# Patient Record
Sex: Male | Born: 1995
Health system: Southern US, Community
[De-identification: ages and names within clinical notes are randomized; demographics above are authoritative.]

## PROBLEM LIST (undated history)

## (undated) DIAGNOSIS — K219 Gastro-esophageal reflux disease without esophagitis: Secondary | ICD-10-CM

## (undated) DIAGNOSIS — F32A Depression, unspecified: Secondary | ICD-10-CM

## (undated) DIAGNOSIS — F419 Anxiety disorder, unspecified: Secondary | ICD-10-CM

## (undated) DIAGNOSIS — F329 Major depressive disorder, single episode, unspecified: Secondary | ICD-10-CM

## (undated) DIAGNOSIS — T7840XA Allergy, unspecified, initial encounter: Secondary | ICD-10-CM

## (undated) HISTORY — DX: Allergy, unspecified, initial encounter: T78.40XA

## (undated) HISTORY — DX: Anxiety disorder, unspecified: F41.9

## (undated) HISTORY — DX: Gastro-esophageal reflux disease without esophagitis: K21.9

## (undated) HISTORY — DX: Major depressive disorder, single episode, unspecified: F32.9

## (undated) HISTORY — DX: Depression, unspecified: F32.A

---
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
## 2017-01-06 ENCOUNTER — Ambulatory Visit (INDEPENDENT_AMBULATORY_CARE_PROVIDER_SITE_OTHER): Payer: 59 | Admitting: Family Medicine

## 2017-01-06 VITALS — BP 125/73 | HR 77 | Temp 98.7°F | Ht 73.0 in | Wt 281.0 lb

## 2017-01-06 DIAGNOSIS — Z68.41 Body mass index (BMI) pediatric, greater than or equal to 95th percentile for age: Secondary | ICD-10-CM | POA: Diagnosis not present

## 2017-01-06 DIAGNOSIS — E669 Obesity, unspecified: Secondary | ICD-10-CM | POA: Diagnosis not present

## 2017-01-06 DIAGNOSIS — E8881 Metabolic syndrome: Secondary | ICD-10-CM

## 2017-01-06 NOTE — Progress Notes (Signed)
Office: [phone]  /  Fax: [phone]   HPI:   Chief Complaint: OBESITY Jason Wu is here to discuss his progress with his obesity treatment plan. Jason Wu is on the  keep a food journal with 1600 calories and 75+grams of protein daily and is following his eating plan approximately 90 % of the time. Jason Wu states Jason Wu is running 30 minutes 2 to 3 times per week. Jason Wu continues to do well with weight loss. Jason Wu has increased exercise and increased H2O. Jason Wu is journaling on and off and increased vegetables. Jason Wu feels well. His weight is 281 lb (127.5 kg) today and has had a weight loss of 2 pounds over a period of 2 weeks since his last visit. Jason Wu has lost 21 lbs since starting treatment with [place].  Insulin Resistance Jason Wu has a diagnosis of insulin resistance based on his elevated fasting insulin level >5. Although Jason Wu's blood glucose readings are still under good control, insulin resistance puts him at greater risk of metabolic syndrome and diabetes. Jason Wu is not taking metformin due to GI upset. Jason Wu continues to work on diet, exercise and weight loss to decrease risk of diabetes. Jason Wu denies hypoglycemia.  Wt Readings from Last 500 Encounters:  01/06/17 281 lb (127.5 kg) (>99 %, Z= 2.84)*  12/23/16 283 lb (128.4 kg) (>99 %, Z= 2.86)*  11/19/16 286 lb (129.7 kg) (>99 %, Z= 2.90)*  11/06/16 290 lb (131.5 kg) (>99 %, Z= 2.94)*  10/15/16 288 lb (130.6 kg) (>99 %, Z= 2.92)*  09/26/16 298 lb (135.2 kg) (>99 %, Z= 3.03)*  09/25/16 293 lb (132.9 kg) (>99 %, Z= 2.98)*  09/11/16 (!) 302 lb (137 kg) (>99 %, Z= 3.07)*  07/16/16 (!) 315 lb (142.9 kg) (>99 %, Z= 3.20)*  07/05/16 (!) 310 lb (140.6 kg) (>99 %, Z= 3.16)*  02/01/16 (!) 307 lb (139.3 kg) (>99 %, Z= 3.18)*  11/19/15 282 lb (127.9 kg) (>99 %, Z= 2.96)*  09/05/15 285 lb (129.3 kg) (>99 %, Z= 3.03)*  05/15/15 262 lb (118.8 kg) (>99 %, Z= 2.82)*  05/10/15 262 lb (118.8 kg) (>99 %, Z= 2.82)*   * Growth percentiles are based on CDC 2-20 Years data.      ALLERGIES: No Known Allergies  MEDICATIONS: Current Outpatient Prescriptions on File Prior to Visit  Medication Sig Dispense Refill  . esomeprazole (NEXIUM) 40 MG capsule Take 1 capsule by mouth daily.  3  . loratadine (CLARITIN) 10 MG tablet Take 10 mg by mouth daily. Reported on 02/01/2016    . Vitamin D, Ergocalciferol, (DRISDOL) 50000 units CAPS capsule Take 1 capsule (50,000 Units total) by mouth every 3 (three) days. 30 capsule 0   No current facility-administered medications on file prior to visit.     PAST MEDICAL HISTORY: Past Medical History:  Diagnosis Date  . Allergy   . Anxiety   . Depression   . GERD (gastroesophageal reflux disease)     PAST SURGICAL HISTORY: No past surgical history on file.  SOCIAL HISTORY: Social History  Substance Use Topics  . Smoking status: Never Smoker  . Smokeless tobacco: Never Used  . Alcohol use No    FAMILY HISTORY: Family History  Problem Relation Age of Onset  . Hyperlipidemia Mother   . Depression Mother   . Anxiety disorder Mother   . Obesity Mother   . Hypertension Father     ROS: Review of Systems  Constitutional: Positive for weight loss.  Gastrointestinal: Positive for diarrhea and nausea.  Endo/Heme/Allergies:  Negative hypoglycemia    PHYSICAL EXAM: Blood pressure 125/73, pulse 77, temperature 98.7 F (37.1 C), temperature source Oral, height 6\' 1"  (1.854 m), weight 281 lb (127.5 kg), SpO2 99 %. Body mass index is 37.07 kg/m. Physical Exam  Constitutional: Jason Wu is oriented to person, place, and time. Jason Wu appears well-developed and well-nourished.  Cardiovascular: Normal rate.   Pulmonary/Chest: Effort normal.  Musculoskeletal: Normal range of motion.  Neurological: Jason Wu is oriented to person, place, and time.  Skin: Skin is warm and dry.  Psychiatric: Jason Wu has a normal mood and affect. His behavior is normal.  Vitals reviewed.   RECENT LABS AND TESTS: BMET    Component Value Date/Time    NA 143 12/23/2016 1013   K 4.5 12/23/2016 1013   CL 102 12/23/2016 1013   CO2 24 12/23/2016 1013   GLUCOSE 84 12/23/2016 1013   GLUCOSE 98 05/15/2015 0815   BUN 11 12/23/2016 1013   CREATININE 0.75 (L) 12/23/2016 1013   CALCIUM 9.7 12/23/2016 1013   GFRNONAA 134 12/23/2016 1013   GFRAA 155 12/23/2016 1013   Lab Results  Component Value Date   HGBA1C 5.3 12/23/2016   HGBA1C 5.2 09/11/2016   Lab Results  Component Value Date   INSULIN 27.3 (H) 12/23/2016   INSULIN 23.5 09/11/2016   CBC    Component Value Date/Time   WBC 6.9 09/11/2016 1142   WBC 7.1 05/15/2015 0815   RBC 4.77 09/11/2016 1142   RBC 5.03 05/15/2015 0815   HGB 14.6 05/15/2015 0815   HCT 39.2 09/11/2016 1142   PLT 210 05/15/2015 0815   MCV 82 09/11/2016 1142   MCH 27.7 09/11/2016 1142   MCH 29.0 05/15/2015 0815   MCHC 33.7 09/11/2016 1142   MCHC 33.8 05/15/2015 0815   RDW 14.8 09/11/2016 1142   LYMPHSABS 1.8 09/11/2016 1142   EOSABS 0.1 09/11/2016 1142   BASOSABS 0.0 09/11/2016 1142   Iron/TIBC/Ferritin/ %Sat No results found for: IRON, TIBC, FERRITIN, IRONPCTSAT Lipid Panel     Component Value Date/Time   CHOL 132 12/23/2016 1013   TRIG 59 12/23/2016 1013   HDL 31 (L) 12/23/2016 1013   LDLCALC 89 12/23/2016 1013   Hepatic Function Panel     Component Value Date/Time   PROT 7.9 12/23/2016 1013   ALBUMIN 4.6 12/23/2016 1013   AST 11 12/23/2016 1013   ALT 13 12/23/2016 1013   ALKPHOS 89 12/23/2016 1013   BILITOT 0.3 12/23/2016 1013      Component Value Date/Time   TSH 1.150 09/11/2016 1142    ASSESSMENT AND PLAN: Insulin resistance  Obesity without serious comorbidity with body mass index (BMI) greater than 99th percentile for age in pediatric patient, unspecified obesity type  PLAN:  Insulin Resistance Jason Wu will continue to work on weight loss, exercise, and decreasing simple carbohydrates in his diet to help decrease the risk of diabetes. We dicussed metformin including benefits  and risks. Jason Wu was informed that eating too many simple carbohydrates or too many calories at one sitting increases the likelihood of GI side effects. Jason Wu agrees to continue metformin but change to 1/2 pill every morning. Jason Wu agreed to follow up with [place] as directed to monitor his progress.  Obesity Jason Wu is currently in the action stage of change. As such, his goal is to continue with weight loss efforts Jason Wu has agreed to keep a food journal with 1600 calories and 75+ grams of protein daily Jason Wu has been instructed to work up to a goal of 150  minutes of combined cardio and strengthening exercise per week for weight loss and overall health benefits. We discussed the following Behavioral Modification Strategies today: increasing lean protein intake and decreasing simple carbohydrates   Jason Wu has agreed to follow up with our clinic in 2 weeks. Jason Wu was informed of the importance of frequent follow up visits to maximize his success with intensive lifestyle modifications for his multiple health conditions.  I, Nevada CraneJoanne Murray, am acting as scribe for Quillian Quincearen Herrle, MD  I have reviewed the above documentation for accuracy and completeness, and I agree with the above. -Quillian Quincearen Mervin, MD

## 2017-01-20 ENCOUNTER — Ambulatory Visit (INDEPENDENT_AMBULATORY_CARE_PROVIDER_SITE_OTHER): Payer: 59 | Admitting: Family Medicine

## 2017-01-20 ENCOUNTER — Encounter (INDEPENDENT_AMBULATORY_CARE_PROVIDER_SITE_OTHER): Payer: Self-pay

## 2017-01-20 ENCOUNTER — Telehealth (INDEPENDENT_AMBULATORY_CARE_PROVIDER_SITE_OTHER): Payer: Self-pay | Admitting: Family Medicine

## 2017-01-20 NOTE — Telephone Encounter (Signed)
Called patient left message advising him to call and pay 25.00 no show fee and we will be happy to reschedule his appointment

## 2017-04-08 ENCOUNTER — Ambulatory Visit (INDEPENDENT_AMBULATORY_CARE_PROVIDER_SITE_OTHER): Payer: 59 | Admitting: Physician Assistant

## 2017-04-08 VITALS — BP 113/77 | HR 75 | Temp 98.5°F | Ht 72.0 in | Wt 275.0 lb

## 2017-04-08 DIAGNOSIS — Z68.41 Body mass index (BMI) pediatric, greater than or equal to 95th percentile for age: Secondary | ICD-10-CM

## 2017-04-08 DIAGNOSIS — E559 Vitamin D deficiency, unspecified: Secondary | ICD-10-CM

## 2017-04-08 DIAGNOSIS — Z9189 Other specified personal risk factors, not elsewhere classified: Secondary | ICD-10-CM

## 2017-04-08 DIAGNOSIS — E669 Obesity, unspecified: Secondary | ICD-10-CM | POA: Diagnosis not present

## 2017-04-08 MED ORDER — VITAMIN D (ERGOCALCIFEROL) 1.25 MG (50000 UNIT) PO CAPS: 50000.0000 [IU] | ORAL_CAPSULE | ORAL | 0 refills | Status: DC

## 2017-04-09 NOTE — Progress Notes (Signed)
Office: [phone]  /  Fax: [phone]   HPI:   Chief Complaint: OBESITY Jason Wu is here to discuss his progress with his obesity treatment plan. He is on the  keep a food journal with 1600 calories and 75+ grams of protein  and is following his eating plan approximately 70 % of the time. He states he is exercising cardio for 45 to 120 minutes 4 times per week. Jason Wu continues to do well with weight loss. He has been increasing his protein intake. He will be going off to college and is unsure when he will be back. His weight is 275 lb (124.7 kg) today and has had a weight loss of 6 pounds over a period of 13 weeks since his last visit. He has lost 6 lbs since starting treatment with [place].  Vitamin D deficiency Jason Wu has a diagnosis of vitamin D deficiency. He is currently taking vit D and denies nausea, vomiting or muscle weakness.  At risk for osteopenia Jason Wu is at higher risk of osteopenia and osteoporosis due to vitamin D deficiency.   ALLERGIES: No Known Allergies  MEDICATIONS: Current Outpatient Prescriptions on File Prior to Visit  Medication Sig Dispense Refill  . esomeprazole (NEXIUM) 40 MG capsule Take 1 capsule by mouth daily.  3  . loratadine (CLARITIN) 10 MG tablet Take 10 mg by mouth daily. Reported on 02/01/2016    . metFORMIN (GLUCOPHAGE) 500 MG tablet Take 250 mg by mouth every morning. Take 1/2 tablet (250 mg) every morning.     No current facility-administered medications on file prior to visit.     PAST MEDICAL HISTORY: Past Medical History:  Diagnosis Date  . Allergy   . Anxiety   . Depression   . GERD (gastroesophageal reflux disease)     PAST SURGICAL HISTORY: No past surgical history on file.  SOCIAL HISTORY: Social History  Substance Use Topics  . Smoking status: Never Smoker  . Smokeless tobacco: Never Used  . Alcohol use No    FAMILY HISTORY: Family History  Problem Relation Age of Onset  . Hyperlipidemia Mother   . Depression Mother     . Anxiety disorder Mother   . Obesity Mother   . Hypertension Father     ROS: Review of Systems  Constitutional: Positive for weight loss.  Gastrointestinal: Negative for nausea and vomiting.  Musculoskeletal:       Negative muscle weakness    PHYSICAL EXAM: Blood pressure 113/77, pulse 75, temperature 98.5 F (36.9 C), temperature source Oral, height 6' (1.829 m), weight 275 lb (124.7 kg), SpO2 99 %. Body mass index is 37.3 kg/m. Physical Exam  Constitutional: He is oriented to person, place, and time. He appears well-developed and well-nourished.  Cardiovascular: Normal rate.   Pulmonary/Chest: Effort normal.  Musculoskeletal: Normal range of motion.  Neurological: He is oriented to person, place, and time.  Skin: Skin is warm.  Psychiatric: He has a normal mood and affect. His behavior is normal.  Vitals reviewed.   RECENT LABS AND TESTS: BMET    Component Value Date/Time   NA 143 12/23/2016 1013   K 4.5 12/23/2016 1013   CL 102 12/23/2016 1013   CO2 24 12/23/2016 1013   GLUCOSE 84 12/23/2016 1013   GLUCOSE 98 05/15/2015 0815   BUN 11 12/23/2016 1013   CREATININE 0.75 (L) 12/23/2016 1013   CALCIUM 9.7 12/23/2016 1013   GFRNONAA 134 12/23/2016 1013   GFRAA 155 12/23/2016 1013   Lab Results  Component Value  Date   HGBA1C 5.3 12/23/2016   HGBA1C 5.2 09/11/2016   Lab Results  Component Value Date   INSULIN 27.3 (H) 12/23/2016   INSULIN 23.5 09/11/2016   CBC    Component Value Date/Time   WBC 6.9 09/11/2016 1142   WBC 7.1 05/15/2015 0815   RBC 4.77 09/11/2016 1142   RBC 5.03 05/15/2015 0815   HGB 13.2 09/11/2016 1142   HCT 39.2 09/11/2016 1142   PLT 210 05/15/2015 0815   MCV 82 09/11/2016 1142   MCH 27.7 09/11/2016 1142   MCH 29.0 05/15/2015 0815   MCHC 33.7 09/11/2016 1142   MCHC 33.8 05/15/2015 0815   RDW 14.8 09/11/2016 1142   LYMPHSABS 1.8 09/11/2016 1142   EOSABS 0.1 09/11/2016 1142   BASOSABS 0.0 09/11/2016 1142   Iron/TIBC/Ferritin/  %Sat No results found for: IRON, TIBC, FERRITIN, IRONPCTSAT Lipid Panel     Component Value Date/Time   CHOL 132 12/23/2016 1013   TRIG 59 12/23/2016 1013   HDL 31 (L) 12/23/2016 1013   LDLCALC 89 12/23/2016 1013   Hepatic Function Panel     Component Value Date/Time   PROT 7.9 12/23/2016 1013   ALBUMIN 4.6 12/23/2016 1013   AST 11 12/23/2016 1013   ALT 13 12/23/2016 1013   ALKPHOS 89 12/23/2016 1013   BILITOT 0.3 12/23/2016 1013      Component Value Date/Time   TSH 1.150 09/11/2016 1142    ASSESSMENT AND PLAN: Vitamin D deficiency - Plan: Vitamin D, Ergocalciferol, (DRISDOL) 50000 units CAPS capsule  At risk for osteoporosis  Obesity without serious comorbidity with body mass index (BMI) greater than 99th percentile for age in pediatric patient, unspecified obesity type  PLAN:  Vitamin D Deficiency Jason Wu was informed that low vitamin D levels contributes to fatigue and are associated with obesity, breast, and colon cancer. He agrees to continue to take prescription Vit D @50 ,000 IU every week, we will refill for 1 month and will follow up for routine testing of vitamin D, at least 2-3 times per year. He was informed of the risk of over-replacement of vitamin D and agrees to not increase his dose unless he discusses this with [place] first. Doy agrees to follow up with our clinic in at least 4 months.  At risk for osteopenia Jason Wu is at risk for osteopenia and osteoporsis due to his vitamin D deficiency. He was encouraged to take his vitamin D and follow his higher calcium diet and increase strengthening exercise to help strengthen his bones and decrease his risk of osteopenia and osteoporosis.  Obesity Jason Wu is currently in the action stage of change. As such, his goal is to continue with weight loss efforts He has agreed to keep a food journal with 1300 to 1700 calories and 75+ grams of protein daily Jason Wu has been instructed to work up to a goal of 150 minutes of combined  cardio and strengthening exercise per week for weight loss and overall health benefits. We discussed the following Behavioral Modification Strategies today: planning for success and decrease eating out  Jason Wu is going off to college and doesn't know when he will be back but has agreed to follow up with our clinic in at least 4 months. He was informed of the importance of frequent follow up visits to maximize his success with intensive lifestyle modifications for his multiple health conditions.  I, [place] Crane, am acting as transcriptionist for Illa Level, PA-C  I have reviewed the above documentation for accuracy and completeness,  and I agree with the above. -Illa LevelSahar Osman, PA-C  I have reviewed the above note and agree with the plan. -Jason Quincearen Mott, MD   OBESITY BEHAVIORAL INTERVENTION VISIT  Today's visit was # 8 out of 22.  Starting weight: 281 lbs Starting date: 09/11/16 Today's weight : 275 lbs Today's date: 04/08/2017 Total lbs lost to date: 6 (Patients must lose 7 lbs in the first 6 months to continue with counseling)   ASK: We discussed the diagnosis of obesity with Jason Wu today and Jason Wu agreed to give us permission to discuss obesity behavioral modification therapy today.  ASSESS: Jason Wu has the diagnosis of obesity and his BMI today is 37.4 Jason Wu is in the action stage of change   ADVISE: Jason Wu was educated on the multiple health risks of obesity as well as the benefit of weight loss to improve his health. He was advised of the need for long term treatment and the importance of lifestyle modifications.  AGREE: Multiple dietary modification options and treatment options were discussed and  Jason Wu agreed to keep a food journal with 1300 to 1700 calories and 75+ grams of protein daily We discussed the following Behavioral Modification Strategies today: planning for success and decrease eating out

## 2017-05-04 ENCOUNTER — Telehealth: Payer: 59 | Admitting: Family

## 2017-05-04 DIAGNOSIS — R6889 Other general symptoms and signs: Secondary | ICD-10-CM | POA: Diagnosis not present

## 2017-05-04 NOTE — Progress Notes (Signed)
E visit for Flu like symptoms   We are sorry that you are not feeling well.  Here is how we plan to help! Based on what you have shared with me it looks like you may have a respiratory virus that may be influenza.  Influenza or "the flu" is   an infection caused by a respiratory virus. The flu virus is highly contagious and persons who did not receive their yearly flu vaccination may "catch" the flu from close contact.  We have anti-viral medications to treat the viruses that cause this infection. They are not a "cure" and only shorten the course of the infection. These prescriptions are most effective when they are given within the first 2 days of "flu" symptoms. Antiviral medication are indicated if you have a high risk of complications from the flu. You should  also consider an antiviral medication if you are in close contact with someone who is at risk. These medications can help patients avoid complications from the flu  but have side effects that you should know. Possible side effects from Tamiflu or oseltamivir include nausea, vomiting, diarrhea, dizziness, headaches, eye redness, sleep problems or other respiratory symptoms. You should not take Tamiflu if you have an allergy to oseltamivir or any to the ingredients in Tamiflu.  Based upon your symptoms and potential risk factors I recommend that you follow the flu symptoms recommendation that I have listed below.  ANYONE WHO HAS FLU SYMPTOMS SHOULD: . Stay home. The flu is highly contagious and going out or to work exposes others! . Be sure to drink plenty of fluids. Water is fine as well as fruit juices, sodas and electrolyte beverages. You may want to stay away from caffeine or alcohol. If you are nauseated, try taking small sips of liquids. How do you know if you are getting enough fluid? Your urine should be a pale yellow or almost colorless. . Get rest. . Taking a steamy shower or using a humidifier may help nasal congestion and ease  sore throat pain. Using a saline nasal spray works much the same way. . Cough drops, hard candies and sore throat lozenges may ease your cough. . Line up a caregiver. Have someone check on you regularly.   GET HELP RIGHT AWAY IF: . You cannot keep down liquids or your medications. . You become short of breath . Your fell like you are going to pass out or loose consciousness. . Your symptoms persist after you have completed your treatment plan MAKE SURE YOU   Understand these instructions.  Will watch your condition.  Will get help right away if you are not doing well or get worse.  Your e-visit answers were reviewed by a board certified advanced clinical practitioner to complete your personal care plan.  Depending on the condition, your plan could have included both over the counter or prescription medications.  If there is a problem please reply  once you have received a response from your provider.  Your safety is important to us.  If you have drug allergies check your prescription carefully.    You can use MyChart to ask questions about today's visit, request a non-urgent call back, or ask for a work or school excuse for 24 hours related to this e-Visit. If it has been greater than 24 hours you will need to follow up with your provider, or enter a new e-Visit to address those concerns.  You will get an e-mail in the next two days asking about   your experience.  I hope that your e-visit has been valuable and will speed your recovery. Thank you for using e-visits.   

## 2017-05-05 ENCOUNTER — Encounter: Payer: Self-pay | Admitting: Family Medicine

## 2017-05-05 ENCOUNTER — Ambulatory Visit (INDEPENDENT_AMBULATORY_CARE_PROVIDER_SITE_OTHER): Payer: 59 | Admitting: Family Medicine

## 2017-05-05 VITALS — BP 120/64 | HR 64 | Temp 98.5°F | Ht 74.0 in | Wt 262.0 lb

## 2017-05-05 DIAGNOSIS — J0100 Acute maxillary sinusitis, unspecified: Secondary | ICD-10-CM | POA: Diagnosis not present

## 2017-05-05 DIAGNOSIS — J40 Bronchitis, not specified as acute or chronic: Secondary | ICD-10-CM

## 2017-05-05 DIAGNOSIS — J029 Acute pharyngitis, unspecified: Secondary | ICD-10-CM | POA: Diagnosis not present

## 2017-05-05 LAB — POCT RAPID STREP A (OFFICE): Rapid Strep A Screen: NEGATIVE

## 2017-05-05 MED ORDER — AMOXICILLIN 500 MG PO CAPS: 500.0000 mg | ORAL_CAPSULE | Freq: Three times a day (TID) | ORAL | 0 refills | Status: DC

## 2017-05-05 MED ORDER — BENZONATATE 100 MG PO CAPS: 100.0000 mg | ORAL_CAPSULE | Freq: Two times a day (BID) | ORAL | 0 refills | Status: DC | PRN

## 2017-05-05 NOTE — Progress Notes (Signed)
Name: Jason Wu   MRN: [id number]    DOB: 1996-01-03   Date:05/05/2017       Progress Note  Subjective  Chief Complaint  Chief Complaint  Patient presents with  . Sinusitis    green production, cough and cong    Sinusitis  This is a new problem. The current episode started in the past 7 days. The problem has been gradually worsening since onset. The maximum temperature recorded prior to his arrival was 101 - 101.9 F. The fever has been present for 1 to 2 days. Associated symptoms include chills, congestion, coughing, diaphoresis, headaches, a hoarse voice, shortness of breath, sinus pressure, sneezing and a sore throat. Pertinent negatives include no ear pain, neck pain or swollen glands. Past treatments include acetaminophen. The treatment provided no relief.  Cough  This is a new problem. The problem has been gradually worsening. The cough is productive of purulent sputum (yellow/greeen). Associated symptoms include chills, headaches, nasal congestion, a sore throat, shortness of breath and wheezing. Pertinent negatives include no chest pain, ear pain, fever, heartburn, myalgias, postnasal drip, rash or weight loss. There is no history of environmental allergies.  Sore Throat   This is a new problem. The current episode started in the past 7 days. The problem has been gradually worsening. The maximum temperature recorded prior to his arrival was 101 - 101.9 F. Associated symptoms include congestion, coughing, headaches, a hoarse voice and shortness of breath. Pertinent negatives include no abdominal pain, diarrhea, ear discharge, ear pain, neck pain or swollen glands. He has tried NSAIDs and acetaminophen for the symptoms. The treatment provided no relief.    No problem-specific Assessment & Plan notes found for this encounter.   Past Medical History:  Diagnosis Date  . Allergy   . Anxiety   . Depression   . GERD (gastroesophageal reflux disease)     No past surgical history on  file.  Family History  Problem Relation Age of Onset  . Hyperlipidemia Mother   . Depression Mother   . Anxiety disorder Mother   . Obesity Mother   . Hypertension Father     Social History   Social History  . Marital status: Single    Spouse name: N/A  . Number of children: N/A  . Years of education: N/A   Occupational History  . student    Social History Main Topics  . Smoking status: Never Smoker  . Smokeless tobacco: Never Used  . Alcohol use No  . Drug use: No  . Sexual activity: No   Other Topics Concern  . Not on file   Social History Narrative  . No narrative on file    No Known Allergies  Outpatient Medications Prior to Visit  Medication Sig Dispense Refill  . Vitamin D, Ergocalciferol, (DRISDOL) 50000 units CAPS capsule Take 1 capsule (50,000 Units total) by mouth every 7 (seven) days. 8 capsule 0  . esomeprazole (NEXIUM) 40 MG capsule Take 1 capsule by mouth daily.  3  . loratadine (CLARITIN) 10 MG tablet Take 10 mg by mouth daily. Reported on 02/01/2016    . metFORMIN (GLUCOPHAGE) 500 MG tablet Take 250 mg by mouth every morning. Take 1/2 tablet (250 mg) every morning.     No facility-administered medications prior to visit.     Review of Systems  Constitutional: Positive for chills and diaphoresis. Negative for fever, malaise/fatigue and weight loss.  HENT: Positive for congestion, hoarse voice, sinus pressure, sneezing and sore throat. Negative  for ear discharge, ear pain and postnasal drip.   Eyes: Negative for blurred vision.  Respiratory: Positive for cough, shortness of breath and wheezing. Negative for sputum production.   Cardiovascular: Negative for chest pain, palpitations and leg swelling.  Gastrointestinal: Negative for abdominal pain, blood in stool, constipation, diarrhea, heartburn, melena and nausea.  Genitourinary: Negative for dysuria, frequency, hematuria and urgency.  Musculoskeletal: Negative for back pain, joint pain, myalgias  and neck pain.  Skin: Negative for rash.  Neurological: Positive for headaches. Negative for dizziness, tingling, sensory change and focal weakness.  Endo/Heme/Allergies: Negative for environmental allergies and polydipsia. Does not bruise/bleed easily.  Psychiatric/Behavioral: Negative for depression and suicidal ideas. The patient is not nervous/anxious and does not have insomnia.      Objective  Vitals:   05/05/17 0808  BP: 120/64  Pulse: 64  Temp: 98.5 F (36.9 C)  Weight: 262 lb (118.8 kg)  Height: 6\' 2"  (1.88 m)    Physical Exam  Constitutional: He is oriented to person, place, and time and well-developed, well-nourished, and in no distress.  HENT:  Head: Normocephalic.  Right Ear: External ear normal.  Left Ear: External ear normal.  Nose: Nose normal.  Mouth/Throat: Uvula is midline and mucous membranes are normal. Posterior oropharyngeal erythema present. No oropharyngeal exudate, posterior oropharyngeal edema or tonsillar abscesses.  Eyes: Pupils are equal, round, and reactive to light. Conjunctivae and EOM are normal. Right eye exhibits no discharge. Left eye exhibits no discharge. No scleral icterus.  Neck: Normal range of motion. Neck supple. No JVD present. No tracheal deviation present. No thyromegaly present.  Cardiovascular: Normal rate, regular rhythm, normal heart sounds and intact distal pulses.  Exam reveals no gallop and no friction rub.   No murmur heard. Pulmonary/Chest: Effort normal and breath sounds normal. No respiratory distress. He has no decreased breath sounds. He has no wheezes. He has no rhonchi. He has no rales.  Abdominal: Soft. Bowel sounds are normal. He exhibits no mass. There is no hepatosplenomegaly. There is no tenderness. There is no rebound, no guarding and no CVA tenderness.  Musculoskeletal: Normal range of motion. He exhibits no edema or tenderness.  Lymphadenopathy:       Head (right side): No submandibular adenopathy present.        Head (left side): No submandibular adenopathy present.    He has no cervical adenopathy.  Neurological: He is alert and oriented to person, place, and time. He has normal sensation, normal strength, normal reflexes and intact cranial nerves. No cranial nerve deficit.  Skin: Skin is warm. No rash noted.  Psychiatric: Mood and affect normal.  Nursing note and vitals reviewed.     Assessment & Plan  Problem List Items Addressed This Visit    None    Visit Diagnoses    Pharyngitis, unspecified etiology    -  Primary   Relevant Medications   amoxicillin (AMOXIL) 500 MG capsule   Other Relevant Orders   POCT rapid strep A (Completed)   Bronchitis       Relevant Medications   amoxicillin (AMOXIL) 500 MG capsule   benzonatate (TESSALON) 100 MG capsule   Acute non-recurrent maxillary sinusitis       Relevant Medications   amoxicillin (AMOXIL) 500 MG capsule   benzonatate (TESSALON) 100 MG capsule      Meds ordered this encounter  Medications  . amoxicillin (AMOXIL) 500 MG capsule    Sig: Take 1 capsule (500 mg total) by mouth 3 (three) times daily.  Dispense:  30 capsule    Refill:  0  . benzonatate (TESSALON) 100 MG capsule    Sig: Take 1 capsule (100 mg total) by mouth 2 (two) times daily as needed for cough.    Dispense:  20 capsule    Refill:  0   Note given for school today   Dr. Elizabeth Sauereanna Myrna Vonseggern [facility]  05/05/17

## 2017-07-09 ENCOUNTER — Ambulatory Visit (INDEPENDENT_AMBULATORY_CARE_PROVIDER_SITE_OTHER): Payer: 59

## 2017-07-09 DIAGNOSIS — Z23 Encounter for immunization: Secondary | ICD-10-CM

## 2017-07-13 ENCOUNTER — Other Ambulatory Visit: Payer: Self-pay

## 2017-07-13 DIAGNOSIS — H1030 Unspecified acute conjunctivitis, unspecified eye: Secondary | ICD-10-CM

## 2017-07-13 MED ORDER — SULFACETAMIDE SODIUM 10 % OP SOLN: 1.0000 [drp] | OPHTHALMIC | 0 refills | Status: DC

## 2017-08-27 ENCOUNTER — Ambulatory Visit (INDEPENDENT_AMBULATORY_CARE_PROVIDER_SITE_OTHER): Payer: 59 | Admitting: Family Medicine

## 2017-08-27 ENCOUNTER — Encounter: Payer: Self-pay | Admitting: Family Medicine

## 2017-08-27 VITALS — BP 120/80 | HR 72 | Temp 98.9°F | Ht 74.0 in | Wt 299.0 lb

## 2017-08-27 DIAGNOSIS — J0100 Acute maxillary sinusitis, unspecified: Secondary | ICD-10-CM | POA: Diagnosis not present

## 2017-08-27 DIAGNOSIS — J40 Bronchitis, not specified as acute or chronic: Secondary | ICD-10-CM

## 2017-08-27 DIAGNOSIS — H1033 Unspecified acute conjunctivitis, bilateral: Secondary | ICD-10-CM

## 2017-08-27 MED ORDER — AMOXICILLIN 500 MG PO CAPS: 500.0000 mg | ORAL_CAPSULE | Freq: Three times a day (TID) | ORAL | 0 refills | Status: DC

## 2017-08-27 MED ORDER — SULFACETAMIDE SODIUM 10 % OP SOLN: 1.0000 [drp] | OPHTHALMIC | 0 refills | Status: DC

## 2017-08-27 NOTE — Progress Notes (Signed)
Name: Jason Wu   MRN: [id number]    DOB: [birth year]   Date:08/27/2017       Progress Note  Subjective  Chief Complaint  Chief Complaint  Patient presents with  . Sinusitis    sore throat, cong, cough- not productive    Sinusitis  This is a new problem. The current episode started 1 to 4 weeks ago. The problem has been waxing and waning since onset. The maximum temperature recorded prior to his arrival was 100.4 - 100.9 F (low grade). The pain is mild. Associated symptoms include chills, congestion, coughing, ear pain, headaches, a hoarse voice, neck pain, sinus pressure, sneezing and a sore throat. Pertinent negatives include no diaphoresis, shortness of breath or swollen glands. Past treatments include acetaminophen and oral decongestants. The treatment provided no relief.  Cough  This is a new problem. The current episode started in the past 7 days. The cough is non-productive. Associated symptoms include chills, ear pain, eye redness, headaches, nasal congestion, postnasal drip and a sore throat. Pertinent negatives include no chest pain, fever, heartburn, myalgias, rash, shortness of breath, weight loss or wheezing. There is no history of environmental allergies.  Conjunctivitis   The onset was gradual. The problem occurs frequently. Associated symptoms include eye itching, congestion, ear pain, headaches, sore throat, neck pain, cough and eye redness. Pertinent negatives include no fever, no abdominal pain, no constipation, no diarrhea, no nausea, no ear discharge, no swollen glands, no wheezing and no rash.    No problem-specific Assessment & Plan notes found for this encounter.   Past Medical History:  Diagnosis Date  . Allergy   . Anxiety   . Depression   . GERD (gastroesophageal reflux disease)     No past surgical history on file.  Family History  Problem Relation Age of Onset  . Hyperlipidemia Mother   . Depression Mother   . Anxiety disorder Mother   .  Obesity Mother   . Hypertension Father     Social History   Socioeconomic History  . Marital status: Single    Spouse name: Not on file  . Number of children: Not on file  . Years of education: Not on file  . Highest education level: Not on file  Social Needs  . Financial resource strain: Not on file  . Food insecurity - worry: Not on file  . Food insecurity - inability: Not on file  . Transportation needs - medical: Not on file  . Transportation needs - non-medical: Not on file  Occupational History  . Occupation: Consulting civil engineer  Tobacco Use  . Smoking status: Never Smoker  . Smokeless tobacco: Never Used  Substance and Sexual Activity  . Alcohol use: No    Alcohol/week: 0.0 oz  . Drug use: No  . Sexual activity: No  Other Topics Concern  . Not on file  Social History Narrative  . Not on file    No Known Allergies  Outpatient Medications Prior to Visit  Medication Sig Dispense Refill  . Vitamin D, Ergocalciferol, (DRISDOL) 50000 units CAPS capsule Take 1 capsule (50,000 Units total) by mouth every 7 (seven) days. 8 capsule 0  . esomeprazole (NEXIUM) 40 MG capsule Take 1 capsule by mouth daily.  3  . amoxicillin (AMOXIL) 500 MG capsule Take 1 capsule (500 mg total) by mouth 3 (three) times daily. 30 capsule 0  . benzonatate (TESSALON) 100 MG capsule Take 1 capsule (100 mg total) by mouth 2 (two) times daily as needed for  cough. 20 capsule 0  . loratadine (CLARITIN) 10 MG tablet Take 10 mg by mouth daily. Reported on 02/01/2016    . metFORMIN (GLUCOPHAGE) 500 MG tablet Take 250 mg by mouth every morning. Take 1/2 tablet (250 mg) every morning.    . sulfacetamide (BLEPH-10) 10 % ophthalmic solution Place 1 drop every 3 (three) hours into both eyes. 15 mL 0   No facility-administered medications prior to visit.     Review of Systems  Constitutional: Positive for chills. Negative for diaphoresis, fever, malaise/fatigue and weight loss.  HENT: Positive for congestion, ear pain,  hoarse voice, postnasal drip, sinus pressure, sneezing and sore throat. Negative for ear discharge.   Eyes: Positive for redness and itching. Negative for blurred vision.  Respiratory: Positive for cough. Negative for sputum production, shortness of breath and wheezing.   Cardiovascular: Negative for chest pain, palpitations and leg swelling.  Gastrointestinal: Negative for abdominal pain, blood in stool, constipation, diarrhea, heartburn, melena and nausea.  Genitourinary: Negative for dysuria, frequency, hematuria and urgency.  Musculoskeletal: Positive for neck pain. Negative for back pain, joint pain and myalgias.  Skin: Negative for rash.  Neurological: Positive for headaches. Negative for dizziness, tingling, sensory change and focal weakness.  Endo/Heme/Allergies: Negative for environmental allergies and polydipsia. Does not bruise/bleed easily.  Psychiatric/Behavioral: Negative for depression and suicidal ideas. The patient is not nervous/anxious and does not have insomnia.      Objective  Vitals:   08/27/17 1332  BP: 120/80  Pulse: 72  Temp: 98.9 F (37.2 C)  TempSrc: Oral  Weight: 299 lb (135.6 kg)  Height: 6\' 2"  (1.88 m)    Physical Exam  Constitutional: He is oriented to person, place, and time and well-developed, well-nourished, and in no distress.  HENT:  Head: Normocephalic.  Right Ear: External ear normal.  Left Ear: External ear normal.  Nose: Right sinus exhibits maxillary sinus tenderness. Left sinus exhibits maxillary sinus tenderness.  Mouth/Throat: Posterior oropharyngeal erythema present.  Eyes: EOM are normal. Pupils are equal, round, and reactive to light. Right eye exhibits no discharge. Left eye exhibits no discharge. Right conjunctiva is injected. Left conjunctiva is injected. No scleral icterus.  Neck: Normal range of motion. Neck supple. No JVD present. No tracheal deviation present. No thyromegaly present.  Cardiovascular: Normal rate, regular  rhythm, normal heart sounds and intact distal pulses. Exam reveals no gallop and no friction rub.  No murmur heard. Pulmonary/Chest: Breath sounds normal. No respiratory distress. He has no wheezes. He has no rales.  Abdominal: Soft. Bowel sounds are normal. He exhibits no mass. There is no hepatosplenomegaly. There is no tenderness. There is no rebound, no guarding and no CVA tenderness.  Musculoskeletal: Normal range of motion. He exhibits no edema or tenderness.  Lymphadenopathy:    He has no cervical adenopathy.  Neurological: He is alert and oriented to person, place, and time. He has normal sensation, normal strength, normal reflexes and intact cranial nerves. No cranial nerve deficit.  Skin: Skin is warm. No rash noted.  Psychiatric: Mood and affect normal.  Nursing note and vitals reviewed.     Assessment & Plan  Problem List Items Addressed This Visit    None    Visit Diagnoses    Acute bacterial conjunctivitis of both eyes    -  Primary   Acute maxillary sinusitis, recurrence not specified       Relevant Medications   amoxicillin (AMOXIL) 500 MG capsule   Bronchitis  Meds ordered this encounter  Medications  . amoxicillin (AMOXIL) 500 MG capsule    Sig: Take 1 capsule (500 mg total) by mouth 3 (three) times daily.    Dispense:  30 capsule    Refill:  0  . sulfacetamide (BLEPH-10) 10 % ophthalmic solution    Sig: Place 1 drop into both eyes every 3 (three) hours.    Dispense:  15 mL    Refill:  0      Dr. Hayden Rasmusseneanna Lanise Mergen [facility]  08/27/17

## 2017-09-22 ENCOUNTER — Ambulatory Visit (INDEPENDENT_AMBULATORY_CARE_PROVIDER_SITE_OTHER): Payer: 59 | Admitting: Physician Assistant

## 2017-09-22 VITALS — BP 144/75 | HR 76 | Temp 98.1°F | Ht 72.0 in | Wt 287.0 lb

## 2017-09-22 DIAGNOSIS — Z6839 Body mass index (BMI) 39.0-39.9, adult: Secondary | ICD-10-CM | POA: Diagnosis not present

## 2017-09-22 DIAGNOSIS — Z9189 Other specified personal risk factors, not elsewhere classified: Secondary | ICD-10-CM | POA: Diagnosis not present

## 2017-09-22 DIAGNOSIS — R5383 Other fatigue: Secondary | ICD-10-CM | POA: Insufficient documentation

## 2017-09-22 DIAGNOSIS — E559 Vitamin D deficiency, unspecified: Secondary | ICD-10-CM | POA: Diagnosis not present

## 2017-09-22 DIAGNOSIS — R030 Elevated blood-pressure reading, without diagnosis of hypertension: Secondary | ICD-10-CM | POA: Diagnosis not present

## 2017-09-22 DIAGNOSIS — E8881 Metabolic syndrome: Secondary | ICD-10-CM | POA: Insufficient documentation

## 2017-09-22 DIAGNOSIS — Z83438 Family history of other disorder of lipoprotein metabolism and other lipidemia: Secondary | ICD-10-CM

## 2017-09-22 MED ORDER — VITAMIN D (ERGOCALCIFEROL) 1.25 MG (50000 UNIT) PO CAPS: 50000.0000 [IU] | ORAL_CAPSULE | ORAL | 0 refills | Status: DC

## 2017-09-23 NOTE — Progress Notes (Signed)
Office: [phone]  /  Fax: [phone]   HPI:   Chief Complaint: OBESITY Jason Wu is here to discuss his progress with his obesity treatment plan. He is on the keep a food journal with 1300 to 1700 calories and 75+ grams of protein daily and is following his eating plan approximately 50 % of the time. He states he is doing cardio and weights for 60 minutes 3 times per week. Jason Wu has been off to college and states he could not follow the meal plan and did not make good food choices. He is motivated to get back on track and continue with weight loss. His weight is 287 lb (130.2 kg) today and has had a weight gain of 12 pounds over a period of 24 weeks since his last visit. He has gained 6 lbs since starting treatment with [place].  Vitamin D deficiency Jason Wu has a diagnosis of vitamin D deficiency. He is currently taking vit D and denies nausea, vomiting or muscle weakness.   Ref. Range 12/23/2016 10:13  Vitamin D, 25-Hydroxy Latest Ref Range: 30.0 - 100.0 ng/mL 33.1   Insulin Resistance Jason Wu has a diagnosis of insulin resistance based on his elevated fasting insulin level >5. Although Jason Wu's blood glucose readings are still under good control, insulin resistance puts him at greater risk of metabolic syndrome and diabetes. He is not taking metformin currently and continues to work on diet and exercise to decrease risk of diabetes. Jason Wu denies polyphagia.  At risk for diabetes Jason Wu is at higher than average risk for developing diabetes due to his obesity and insulin resistance. He currently denies polyuria or polydipsia.  Fatigue Jason Wu has noticed more fatigue recently. Will check his thyroid and CBC.   Family History of Hyperlipidemia Jason Wu has a family history of hyperlipidemia. His last LDL was < 100 and he is working to control his cholesterol levels with intensive lifestyle modification including a low saturated fat diet, exercise and weight loss.   Elevated Blood Pressure without  diagnosis of hypertension Jason Wu blood pressure is elevated at 144/75 today. He denies any history of HTN.  He admits to having headache this morning. He denies any sleep changes.  He  denies chest pain or shortness of breath on exertion. Jason Wu blood pressure is not currently controlled.  ALLERGIES: No Known Allergies  MEDICATIONS: Current Outpatient Medications on File Prior to Visit  Medication Sig Dispense Refill  . amoxicillin (AMOXIL) 500 MG capsule Take 1 capsule (500 mg total) by mouth 3 (three) times daily. 30 capsule 0  . esomeprazole (NEXIUM) 40 MG capsule Take 1 capsule by mouth daily.  3  . sulfacetamide (BLEPH-10) 10 % ophthalmic solution Place 1 drop into both eyes every 3 (three) hours. 15 mL 0   No current facility-administered medications on file prior to visit.     PAST MEDICAL HISTORY: Past Medical History:  Diagnosis Date  . Allergy   . Anxiety   . Depression   . GERD (gastroesophageal reflux disease)     PAST SURGICAL HISTORY: No past surgical history on file.  SOCIAL HISTORY: Social History   Tobacco Use  . Smoking status: Never Smoker  . Smokeless tobacco: Never Used  Substance Use Topics  . Alcohol use: No    Alcohol/week: 0.0 oz  . Drug use: No    FAMILY HISTORY: Family History  Problem Relation Age of Onset  . Hyperlipidemia Mother   . Depression Mother   . Anxiety disorder Mother   . Obesity Mother   .  Hypertension Father     ROS: Review of Systems  Constitutional: Positive for malaise/fatigue. Negative for weight loss.  Respiratory: Negative for shortness of breath (on exertion).   Cardiovascular: Negative for chest pain.  Gastrointestinal: Negative for nausea and vomiting.  Genitourinary: Negative for frequency.  Musculoskeletal:       Negative muscle weakness  Neurological: Positive for headaches.  Endo/Heme/Allergies: Negative for polydipsia.       Negative polyphagia    PHYSICAL EXAM: Blood pressure (!) 144/75, pulse  76, temperature 98.1 F (36.7 C), temperature source Oral, height 6' (1.829 m), weight 287 lb (130.2 kg), SpO2 100 %. Body mass index is 38.92 kg/m. Physical Exam  Constitutional: He is oriented to person, place, and time. He appears well-developed and well-nourished.  Cardiovascular: Normal rate.  Pulmonary/Chest: Effort normal.  Musculoskeletal: Normal range of motion.  Neurological: He is oriented to person, place, and time.  Skin: Skin is warm and dry.  Psychiatric: He has a normal mood and affect. His behavior is normal.  Vitals reviewed.   RECENT LABS AND TESTS: BMET    Component Value Date/Time   NA 143 12/23/2016 1013   K 4.5 12/23/2016 1013   CL 102 12/23/2016 1013   CO2 24 12/23/2016 1013   GLUCOSE 84 12/23/2016 1013   GLUCOSE 98 05/15/2015 0815   BUN 11 12/23/2016 1013   CREATININE 0.75 (L) 12/23/2016 1013   CALCIUM 9.7 12/23/2016 1013   GFRNONAA 134 12/23/2016 1013   GFRAA 155 12/23/2016 1013   Lab Results  Component Value Date   HGBA1C 5.3 12/23/2016   HGBA1C 5.2 09/11/2016   Lab Results  Component Value Date   INSULIN 27.3 (H) 12/23/2016   INSULIN 23.5 09/11/2016   CBC    Component Value Date/Time   WBC 6.9 09/11/2016 1142   WBC 7.1 05/15/2015 0815   RBC 4.77 09/11/2016 1142   RBC 5.03 05/15/2015 0815   HGB 13.2 09/11/2016 1142   HCT 39.2 09/11/2016 1142   PLT 210 05/15/2015 0815   MCV 82 09/11/2016 1142   MCH 27.7 09/11/2016 1142   MCH 29.0 05/15/2015 0815   MCHC 33.7 09/11/2016 1142   MCHC 33.8 05/15/2015 0815   RDW 14.8 09/11/2016 1142   LYMPHSABS 1.8 09/11/2016 1142   EOSABS 0.1 09/11/2016 1142   BASOSABS 0.0 09/11/2016 1142   Iron/TIBC/Ferritin/ %Sat No results found for: IRON, TIBC, FERRITIN, IRONPCTSAT Lipid Panel     Component Value Date/Time   CHOL 132 12/23/2016 1013   TRIG 59 12/23/2016 1013   HDL 31 (L) 12/23/2016 1013   LDLCALC 89 12/23/2016 1013   Hepatic Function Panel     Component Value Date/Time   PROT 7.9  12/23/2016 1013   ALBUMIN 4.6 12/23/2016 1013   AST 11 12/23/2016 1013   ALT 13 12/23/2016 1013   ALKPHOS 89 12/23/2016 1013   BILITOT 0.3 12/23/2016 1013      Component Value Date/Time   TSH 1.150 09/11/2016 1142     Ref. Range 12/23/2016 10:13  Vitamin D, 25-Hydroxy Latest Ref Range: 30.0 - 100.0 ng/mL 33.1   ASSESSMENT AND PLAN: Vitamin D deficiency - Plan: VITAMIN D 25 Hydroxy (Vit-D Deficiency, Fractures), Vitamin D, Ergocalciferol, (DRISDOL) 50000 units CAPS capsule  Insulin resistance - Plan: Comprehensive metabolic panel, Hemoglobin A1c, Insulin, random  Other fatigue - Plan: CBC With Differential, Lipid Panel With LDL/HDL Ratio, T3, T4, free, TSH  Elevated blood pressure reading without diagnosis of hypertension  Family history of hyperlipidemia - Plan: Lipid  Panel With LDL/HDL Ratio  At risk for diabetes mellitus  Class 2 severe obesity with serious comorbidity and body mass index (BMI) of 39.0 to 39.9 in adult, unspecified obesity type (HCC)  PLAN:  Vitamin D Deficiency Jason Wu was informed that low vitamin D levels contributes to fatigue and are associated with obesity, breast, and colon cancer. He agrees to continue to take prescription Vit D @50 ,000 IU every week #4 with no refills. We will check labs and will follow up for routine testing of vitamin D, at least 2-3 times per year. He was informed of the risk of over-replacement of vitamin D and agrees to not increase his dose unless he discusses this with [place] first. Jason Wu agrees to follow up with our clinic in 3 weeks.  Insulin Resistance Jason Wu will continue to work on weight loss, exercise, and decreasing simple carbohydrates in his diet to help decrease the risk of diabetes. We dicussed metformin including benefits and risks. He was informed that eating too many simple carbohydrates or too many calories at one sitting increases the likelihood of GI side effects. Jason Wu declined metformin for now and prescription was  not written today. We will check labs and Jason Wu agreed to follow up with [place] as directed to monitor his progress.  Diabetes risk counseling Jason Wu was given extended (15 minutes) diabetes prevention counseling today. He is 22 y.o. male and has risk factors for diabetes including obesity and insulin resistance. We discussed intensive lifestyle modifications today with an emphasis on weight loss as well as increasing exercise and decreasing simple carbohydrates in his diet.  Fatigue Jadarius was informed that his fatigue may be related to obesity, depression or many other causes. We will check CBC and thyroid panel and in the meanwhile Jason Wu has agreed to work on diet, exercise and weight loss to help with fatigue. Proper sleep hygiene was discussed including the need for 7-8 hours of quality sleep each night.  Family History of Hyperlipidemia Jason Wu was informed of the American Heart Association Guidelines emphasizing intensive lifestyle modifications as the first line treatment for hyperlipidemia. We discussed many lifestyle modifications today in depth, and Jason Wu will continue to work on decreasing saturated fats such as fatty red meat, butter and many fried foods. He will also increase vegetables and lean protein in his diet and continue to work on exercise and weight loss efforts. We will check labs and Jason Wu agrees to follow up as directed.  Elevated Blood Pressure without diagnosis of hypertension We discussed sodium restriction, working on healthy weight loss, and a regular exercise program as the means to achieve improved blood pressure control. Jason Wu was advised to keep a blood pressure log and bring in for review. We will continue to monitor his blood pressure as well as his progress with the above lifestyle modifications.  Obesity Jason Wu is currently in the action stage of change. As such, his goal is to continue with weight loss efforts He has agreed to follow the Category 3 plan Jason Wu has been  instructed to work up to a goal of 150 minutes of combined cardio and strengthening exercise per week for weight loss and overall health benefits. We discussed the following Behavioral Modification Strategies today: planning for success, increasing lean protein intake and work on meal planning and easy cooking plans  Jason Wu has agreed to follow up with our clinic in 3 weeks. He was informed of the importance of frequent follow up visits to maximize his success with intensive lifestyle modifications for his multiple  health conditions.   OBESITY BEHAVIORAL INTERVENTION VISIT  Today's visit was # 9 out of 22.  Starting weight: 281 lbs Starting date: 09/11/16 Today's weight : 287 lbs Today's date: 09/22/2017 Total lbs lost to date: 0 (Patients must lose 7 lbs in the first 6 months to continue with counseling)   ASK: We discussed the diagnosis of obesity with Jason Wu today and Jason Wu agreed to give us permission to discuss obesity behavioral modification therapy today.  ASSESS: Jason Wu has the diagnosis of obesity and his BMI today is 38.92 Jason Wu is in the action stage of change   ADVISE: Dam was educated on the multiple health risks of obesity as well as the benefit of weight loss to improve his health. He was advised of the need for long term treatment and the importance of lifestyle modifications.  AGREE: Multiple dietary modification options and treatment options were discussed and  Rucker agreed to the above obesity treatment plan.   Cristi LoronI, Joanne Murray, am acting as transcriptionist for [employer] CorporationSahar Ger Ringenberg, PA-C I Illa LevelSahar Amyah Clawson [facility] have reviewed this note and agree with its content.

## 2017-09-29 DIAGNOSIS — R5383 Other fatigue: Secondary | ICD-10-CM | POA: Diagnosis not present

## 2017-09-29 DIAGNOSIS — E559 Vitamin D deficiency, unspecified: Secondary | ICD-10-CM | POA: Diagnosis not present

## 2017-09-29 DIAGNOSIS — Z83438 Family history of other disorder of lipoprotein metabolism and other lipidemia: Secondary | ICD-10-CM | POA: Diagnosis not present

## 2017-09-29 DIAGNOSIS — E8881 Metabolic syndrome: Secondary | ICD-10-CM | POA: Diagnosis not present

## 2017-09-30 LAB — COMPREHENSIVE METABOLIC PANEL
ALBUMIN: 4.4 g/dL (ref 3.5–5.5)
ALT: 16 IU/L (ref 0–44)
AST: 17 IU/L (ref 0–40)
Albumin/Globulin Ratio: 1.4 (ref 1.2–2.2)
Alkaline Phosphatase: 62 IU/L (ref 39–117)
BUN / CREAT RATIO: 9 (ref 9–20)
BUN: 7 mg/dL (ref 6–20)
Bilirubin Total: 0.4 mg/dL (ref 0.0–1.2)
CO2: 23 mmol/L (ref 20–29)
CREATININE: 0.75 mg/dL — AB (ref 0.76–1.27)
Calcium: 9 mg/dL (ref 8.7–10.2)
Chloride: 101 mmol/L (ref 96–106)
GFR calc Af Amer: 154 mL/min/{1.73_m2} (ref >59)
GFR calc non Af Amer: 133 mL/min/{1.73_m2} (ref >59)
GLUCOSE: 76 mg/dL (ref 65–99)
Globulin, Total: 3.1 g/dL (ref 1.5–4.5)
Potassium: 3.9 mmol/L (ref 3.5–5.2)
Sodium: 142 mmol/L (ref 134–144)
TOTAL PROTEIN: 7.5 g/dL (ref 6.0–8.5)

## 2017-09-30 LAB — LIPID PANEL WITH LDL/HDL RATIO
Cholesterol, Total: 105 mg/dL (ref 100–169)
HDL: 21 mg/dL — AB (ref >39)
LDL Calculated: 64 mg/dL (ref 0–109)
LDL/HDL RATIO: 3 ratio (ref 0.0–3.6)
TRIGLYCERIDES: 99 mg/dL — AB (ref 0–89)
VLDL Cholesterol Cal: 20 mg/dL (ref 5–40)

## 2017-09-30 LAB — CBC WITH DIFFERENTIAL
BASOS: 0 %
Basophils Absolute: 0 10*3/uL (ref 0.0–0.2)
EOS (ABSOLUTE): 0.2 10*3/uL (ref 0.0–0.4)
EOS: 3 %
HEMATOCRIT: 37.8 % (ref 37.5–51.0)
HEMOGLOBIN: 13.1 g/dL (ref 13.0–17.7)
IMMATURE GRANS (ABS): 0 10*3/uL (ref 0.0–0.1)
Immature Granulocytes: 0 %
Lymphocytes Absolute: 1.8 10*3/uL (ref 0.7–3.1)
Lymphs: 29 %
MCH: 28.4 pg (ref 26.6–33.0)
MCHC: 34.7 g/dL (ref 31.5–35.7)
MCV: 82 fL (ref 79–97)
MONOCYTES: 13 %
MONOS ABS: 0.8 10*3/uL (ref 0.1–0.9)
Neutrophils Absolute: 3.6 10*3/uL (ref 1.4–7.0)
Neutrophils: 55 %
RBC: 4.62 x10E6/uL (ref 4.14–5.80)
RDW: 13.1 % (ref 12.3–15.4)
WBC: 6.4 10*3/uL (ref 3.4–10.8)

## 2017-09-30 LAB — VITAMIN D 25 HYDROXY (VIT D DEFICIENCY, FRACTURES): Vit D, 25-Hydroxy: 18.3 ng/mL — ABNORMAL LOW (ref 30.0–100.0)

## 2017-09-30 LAB — INSULIN, RANDOM: INSULIN: 14.1 u[IU]/mL (ref 2.6–24.9)

## 2017-09-30 LAB — T4, FREE: Free T4: 1.17 ng/dL (ref 0.93–1.60)

## 2017-09-30 LAB — HEMOGLOBIN A1C
ESTIMATED AVERAGE GLUCOSE: 108 mg/dL
Hgb A1c MFr Bld: 5.4 % (ref 4.8–5.6)

## 2017-09-30 LAB — T3: T3, Total: 99 ng/dL (ref 71–180)

## 2017-09-30 LAB — TSH: TSH: 2.43 u[IU]/mL (ref 0.450–4.500)

## 2017-10-13 ENCOUNTER — Encounter (INDEPENDENT_AMBULATORY_CARE_PROVIDER_SITE_OTHER): Payer: Self-pay

## 2017-10-13 ENCOUNTER — Ambulatory Visit (INDEPENDENT_AMBULATORY_CARE_PROVIDER_SITE_OTHER): Payer: 59 | Admitting: Physician Assistant

## 2017-10-29 DIAGNOSIS — S53402A Unspecified sprain of left elbow, initial encounter: Secondary | ICD-10-CM | POA: Diagnosis not present

## 2017-11-04 DIAGNOSIS — S53402A Unspecified sprain of left elbow, initial encounter: Secondary | ICD-10-CM | POA: Diagnosis not present

## 2017-11-09 DIAGNOSIS — M25522 Pain in left elbow: Secondary | ICD-10-CM | POA: Diagnosis not present

## 2018-01-15 ENCOUNTER — Emergency Department
Admission: EM | Admit: 2018-01-15 | Discharge: 2018-01-15 | Disposition: A | Discharge status: Home / Self Care | Payer: 59 | Attending: Emergency Medicine | Admitting: Emergency Medicine

## 2018-01-15 ENCOUNTER — Other Ambulatory Visit: Payer: Self-pay

## 2018-01-15 DIAGNOSIS — R197 Diarrhea, unspecified: Secondary | ICD-10-CM | POA: Diagnosis not present

## 2018-01-15 DIAGNOSIS — R1084 Generalized abdominal pain: Secondary | ICD-10-CM | POA: Diagnosis not present

## 2018-01-15 DIAGNOSIS — F329 Major depressive disorder, single episode, unspecified: Secondary | ICD-10-CM | POA: Diagnosis not present

## 2018-01-15 DIAGNOSIS — R109 Unspecified abdominal pain: Secondary | ICD-10-CM | POA: Diagnosis not present

## 2018-01-15 DIAGNOSIS — Z79899 Other long term (current) drug therapy: Secondary | ICD-10-CM | POA: Insufficient documentation

## 2018-01-15 DIAGNOSIS — R112 Nausea with vomiting, unspecified: Secondary | ICD-10-CM | POA: Diagnosis not present

## 2018-01-15 DIAGNOSIS — F419 Anxiety disorder, unspecified: Secondary | ICD-10-CM | POA: Insufficient documentation

## 2018-01-15 LAB — URINALYSIS, COMPLETE (UACMP) WITH MICROSCOPIC
Bacteria, UA: NONE SEEN
Bilirubin Urine: NEGATIVE
GLUCOSE, UA: NEGATIVE mg/dL
HGB URINE DIPSTICK: NEGATIVE
KETONES UR: NEGATIVE mg/dL
Leukocytes, UA: NEGATIVE
NITRITE: NEGATIVE
PROTEIN: NEGATIVE mg/dL
Specific Gravity, Urine: 1.012 (ref 1.005–1.030)
Squamous Epithelial / LPF: NONE SEEN (ref 0–5)
WBC UA: NONE SEEN WBC/hpf (ref 0–5)
pH: 6 (ref 5.0–8.0)

## 2018-01-15 LAB — LIPASE, BLOOD: Lipase: 23 U/L (ref 11–51)

## 2018-01-15 LAB — COMPREHENSIVE METABOLIC PANEL
ALBUMIN: 4.3 g/dL (ref 3.5–5.0)
ALT: 17 U/L (ref 17–63)
AST: 18 U/L (ref 15–41)
Alkaline Phosphatase: 77 U/L (ref 38–126)
Anion gap: 6 (ref 5–15)
BILIRUBIN TOTAL: 0.5 mg/dL (ref 0.3–1.2)
BUN: 8 mg/dL (ref 6–20)
CALCIUM: 9 mg/dL (ref 8.9–10.3)
CO2: 26 mmol/L (ref 22–32)
Chloride: 106 mmol/L (ref 101–111)
Creatinine, Ser: 0.67 mg/dL (ref 0.61–1.24)
GFR calc non Af Amer: >60 mL/min (ref >60)
Glucose, Bld: 106 mg/dL — ABNORMAL HIGH (ref 65–99)
Potassium: 3.8 mmol/L (ref 3.5–5.1)
Sodium: 138 mmol/L (ref 135–145)
TOTAL PROTEIN: 8.1 g/dL (ref 6.5–8.1)

## 2018-01-15 LAB — CBC
HCT: 42.4 % (ref 40.0–52.0)
HEMOGLOBIN: 14.4 g/dL (ref 13.0–18.0)
MCH: 29.1 pg (ref 26.0–34.0)
MCHC: 34 g/dL (ref 32.0–36.0)
MCV: 85.7 fL (ref 80.0–100.0)
Platelets: 234 10*3/uL (ref 150–440)
RBC: 4.95 MIL/uL (ref 4.40–5.90)
RDW: 13.1 % (ref 11.5–14.5)
WBC: 6.9 10*3/uL (ref 3.8–10.6)

## 2018-01-15 MED ORDER — ONDANSETRON HCL 4 MG PO TABS: 4.0000 mg | ORAL_TABLET | Freq: Once | ORAL | Status: DC

## 2018-01-15 MED ORDER — DICYCLOMINE HCL 20 MG PO TABS: 20.0000 mg | ORAL_TABLET | Freq: Three times a day (TID) | ORAL | 0 refills | Status: DC | PRN

## 2018-01-15 MED ORDER — ONDANSETRON HCL 4 MG PO TABS: 4.0000 mg | ORAL_TABLET | Freq: Every day | ORAL | 0 refills | Status: DC | PRN

## 2018-01-15 MED ORDER — ONDANSETRON 4 MG PO TBDP
4.0000 mg | ORAL_TABLET | Freq: Once | ORAL | Status: AC
  Administered 2018-01-15: 4 mg via ORAL

## 2018-01-15 MED ORDER — ONDANSETRON 4 MG PO TBDP
ORAL_TABLET | ORAL | Status: AC
  Administered 2018-01-15: 4 mg via ORAL
  Filled 2018-01-15: qty 1

## 2018-01-15 NOTE — ED Provider Notes (Signed)
[facility] Emergency Department Provider Note  ___________________________________________   First MD Initiated Contact with Patient 01/15/18 1403     (approximate)  I have reviewed the triage vital signs and the nursing notes.   HISTORY  Chief Complaint Abdominal Pain and Emesis   HPI Jason Wu is a 22 y.o. male with history of anxiety depression was presented emergency department today with 3 days of lower abdominal cramping associated with nausea and vomiting.  He says that he was vomiting and was constantly for 2 hours yesterday but has only vomited a small amount as of this morning.  He says that he is also had several episodes of diarrhea.  Does not report any blood in his vomitus or stool.  No known sick contacts.  No history of abdominal surgery.  Denies taking any medication on a regular basis at home.  Denies any smoking, drinking or drug use.  Says that his pain is a 7 out of 10 at this time and intermittent.  Does not report any radiation.   Past Medical History:  Diagnosis Date  . Allergy   . Anxiety   . Depression   . GERD (gastroesophageal reflux disease)     Patient Active Problem List   Diagnosis Date Noted  . Insulin resistance 09/22/2017  . Other fatigue 09/22/2017  . Elevated blood pressure reading without diagnosis of hypertension 09/22/2017  . Vitamin D deficiency 09/25/2016  . Class 2 obesity with serious comorbidity and body mass index (BMI) of 37.0 to 37.9 in adult 09/25/2016  . Shortness of breath 09/11/2016    History reviewed. No pertinent surgical history.  Prior to Admission medications   Medication Sig Start Date End Date Taking? Authorizing Provider  amoxicillin (AMOXIL) 500 MG capsule Take 1 capsule (500 mg total) by mouth 3 (three) times daily. 08/27/17   Duanne Limerick, MD  esomeprazole (NEXIUM) 40 MG capsule Take 1 capsule by mouth daily. 07/31/15   [provider]  sulfacetamide (BLEPH-10) 10 %  ophthalmic solution Place 1 drop into both eyes every 3 (three) hours. 08/27/17   Duanne Limerick, MD  Vitamin D, Ergocalciferol, (DRISDOL) 50000 units CAPS capsule Take 1 capsule (50,000 Units total) by mouth every 7 (seven) days. 09/22/17   Demetrio Lapping, PA-C    Allergies Patient has no known allergies.  Family History  Problem Relation Age of Onset  . Hyperlipidemia Mother   . Depression Mother   . Anxiety disorder Mother   . Obesity Mother   . Hypertension Father     Social History Social History   Tobacco Use  . Smoking status: Never Smoker  . Smokeless tobacco: Never Used  Substance Use Topics  . Alcohol use: No    Alcohol/week: 0.0 oz  . Drug use: No    Review of Systems  Constitutional: No fever/chills Eyes: No visual changes. ENT: No sore throat. Cardiovascular: Denies chest pain. Respiratory: Denies shortness of breath. Gastrointestinal:  No constipation. Genitourinary: Negative for dysuria. Musculoskeletal: Negative for back pain. Skin: Negative for rash. Neurological: Negative for headaches, focal weakness or numbness.   ____________________________________________   PHYSICAL EXAM:  VITAL SIGNS: ED Triage Vitals  Enc Vitals Group     BP 01/15/18 1121 (!) 145/77     Pulse Rate 01/15/18 1121 64     Resp 01/15/18 1121 18     Temp 01/15/18 1121 99.1 F (37.3 C)     Temp Source 01/15/18 1121 Oral  SpO2 01/15/18 1121 99 %     Weight 01/15/18 1121 292 lb (132.5 kg)     Height 01/15/18 1121 6' (1.829 m)     Head Circumference --      Peak Flow --      Pain Score 01/15/18 1150 8     Pain Loc --      Pain Edu? --      Excl. in GC? --     Constitutional: Alert and oriented. Well appearing and in no acute distress. Eyes: Conjunctivae are normal.  Head: Atraumatic. Nose: No congestion/rhinnorhea. Mouth/Throat: Mucous membranes are moist.  Neck: No stridor.   Cardiovascular: Normal rate, regular rhythm. Grossly normal heart sounds.  Good  peripheral circulation. Respiratory: Normal respiratory effort.  No retractions. Lungs CTAB. Gastrointestinal: Soft with minimal diffuse tenderness to palpation.  Negative Murphy sign. No distention.  Musculoskeletal: No lower extremity tenderness nor edema.  No joint effusions. Neurologic:  Normal speech and language. No gross focal neurologic deficits are appreciated. Skin:  Skin is warm, dry and intact. No rash noted. Psychiatric: Mood and affect are normal. Speech and behavior are normal.  ____________________________________________   LABS (all labs ordered are listed, but only abnormal results are displayed)  Labs Reviewed  COMPREHENSIVE METABOLIC PANEL - Abnormal; Notable for the following components:      Result Value   Glucose, Bld 106 (*)    All other components within normal limits  URINALYSIS, COMPLETE (UACMP) WITH MICROSCOPIC - Abnormal; Notable for the following components:   Color, Urine YELLOW (*)    APPearance CLEAR (*)    All other components within normal limits  LIPASE, BLOOD  CBC   ____________________________________________  EKG   ____________________________________________  RADIOLOGY   ____________________________________________   PROCEDURES  Procedure(s) performed:   Procedures  Critical Care performed:   ____________________________________________   INITIAL IMPRESSION / ASSESSMENT AND PLAN / ED COURSE  Pertinent labs & imaging results that were available during my care of the patient were reviewed by me and considered in my medical decision making (see chart for details).  Differential diagnosis includes, but is not limited to, acute appendicitis, renal colic, testicular torsion, urinary tract infection/pyelonephritis, prostatitis,  epididymitis, diverticulitis, small bowel obstruction or ileus, colitis, abdominal aortic aneurysm, gastroenteritis, hernia, etc. As part of my medical decision making, I reviewed the following data  within the electronic MEDICAL RECORD NUMBER Notes from prior ED visits  ----------------------------------------- 2:12 PM on 01/15/2018 -----------------------------------------  Patient with nonfocal abdominal exam.  Minimal tenderness diffusely without any rebound or guarding.  Very reassuring lab work.  Likely viral illness.  Given this I believe the patient will benefit from Zofran as well as Bentyl and follow-up in the office with his primary care doctor.  However, we did discuss return precautions especially any increase in pain or migration of the pain to the right lower quadrant or any other worsening or concerning symptoms, especially fever, nausea or vomiting.  The patient is understanding of the diagnosis as well as treatment plan willing to comply. ____________________________________________   FINAL CLINICAL IMPRESSION(S) / ED DIAGNOSES  Abdominal pain with nausea vomiting and diarrhea.    NEW MEDICATIONS STARTED DURING THIS VISIT:  New Prescriptions   No medications on file     Note:  This document was prepared using Dragon voice recognition software and may include unintentional dictation errors.     Myrna Blazer, MD 01/15/18 [phone]

## 2018-01-15 NOTE — ED Triage Notes (Signed)
Pt alert, oriented ,ambulatory. C/o lower abd pain all the way across x 3 days. Started vomiting yesterday, states little bit of diarrhea. Still has belly organs. States urinary frequency. No distress noted.

## 2018-05-05 ENCOUNTER — Ambulatory Visit (INDEPENDENT_AMBULATORY_CARE_PROVIDER_SITE_OTHER): Payer: 59 | Admitting: Family Medicine

## 2018-05-05 ENCOUNTER — Encounter: Payer: Self-pay | Admitting: Family Medicine

## 2018-05-05 VITALS — BP 120/64 | HR 80 | Ht 72.0 in | Wt 303.0 lb

## 2018-05-05 DIAGNOSIS — L309 Dermatitis, unspecified: Secondary | ICD-10-CM

## 2018-05-05 MED ORDER — PREDNISONE 10 MG PO TABS: ORAL_TABLET | ORAL | 1 refills | Status: DC

## 2018-05-05 NOTE — Patient Instructions (Signed)

## 2018-05-05 NOTE — Progress Notes (Signed)
Name: Jason Wu   MRN: [id number]    DOB: [birth year]   Date:05/05/2018       Progress Note  Subjective  Chief Complaint  Chief Complaint  Patient presents with  . Rash    broke out yesterday am, mainly on face and arms. itches and raised bumps that run together. face "feels swollen'    Rash  This is a new problem. The current episode started yesterday. The problem has been gradually worsening since onset. The affected locations include the face, head, right arm and left arm. The rash is characterized by itchiness. He was exposed to nothing. Pertinent negatives include no anorexia, congestion, cough, diarrhea, eye pain, facial edema, fatigue, fever, joint pain, nail changes, rhinorrhea, shortness of breath, sore throat or vomiting. Past treatments include nothing. There is no history of allergies or asthma.    No problem-specific Assessment & Plan notes found for this encounter.   Past Medical History:  Diagnosis Date  . Allergy   . Anxiety   . Depression   . GERD (gastroesophageal reflux disease)     History reviewed. No pertinent surgical history.  Family History  Problem Relation Age of Onset  . Hyperlipidemia Mother   . Depression Mother   . Anxiety disorder Mother   . Obesity Mother   . Hypertension Father     Social History   Socioeconomic History  . Marital status: Single    Spouse name: Not on file  . Number of children: Not on file  . Years of education: Not on file  . Highest education level: Not on file  Occupational History  . Occupation: Consulting civil engineer  Social Needs  . Financial resource strain: Not on file  . Food insecurity:    Worry: Not on file    Inability: Not on file  . Transportation needs:    Medical: Not on file    Non-medical: Not on file  Tobacco Use  . Smoking status: Never Smoker  . Smokeless tobacco: Never Used  Substance and Sexual Activity  . Alcohol use: No    Alcohol/week: 0.0 standard drinks  . Drug use: No  . Sexual  activity: Never  Lifestyle  . Physical activity:    Days per week: Not on file    Minutes per session: Not on file  . Stress: Not on file  Relationships  . Social connections:    Talks on phone: Not on file    Gets together: Not on file    Attends religious service: Not on file    Active member of club or organization: Not on file    Attends meetings of clubs or organizations: Not on file    Relationship status: Not on file  . Intimate partner violence:    Fear of current or ex partner: Not on file    Emotionally abused: Not on file    Physically abused: Not on file    Forced sexual activity: Not on file  Other Topics Concern  . Not on file  Social History Narrative  . Not on file    No Known Allergies  Outpatient Medications Prior to Visit  Medication Sig Dispense Refill  . Vitamin D, Ergocalciferol, (DRISDOL) 50000 units CAPS capsule Take 1 capsule (50,000 Units total) by mouth every 7 (seven) days. 8 capsule 0  . dicyclomine (BENTYL) 20 MG tablet Take 1 tablet (20 mg total) by mouth 3 (three) times daily as needed for spasms. (Patient not taking: Reported on 05/05/2018) 30 tablet 0  .  esomeprazole (NEXIUM) 40 MG capsule Take 1 capsule by mouth daily.  3  . amoxicillin (AMOXIL) 500 MG capsule Take 1 capsule (500 mg total) by mouth 3 (three) times daily. 30 capsule 0  . ondansetron (ZOFRAN) 4 MG tablet Take 1 tablet (4 mg total) by mouth daily as needed. 10 tablet 0  . sulfacetamide (BLEPH-10) 10 % ophthalmic solution Place 1 drop into both eyes every 3 (three) hours. 15 mL 0   No facility-administered medications prior to visit.     Review of Systems  Constitutional: Negative for chills, fatigue, fever, malaise/fatigue and weight loss.  HENT: Negative for congestion, ear discharge, ear pain, rhinorrhea and sore throat.   Eyes: Negative for blurred vision and pain.  Respiratory: Negative for cough, sputum production, shortness of breath and wheezing.   Cardiovascular:  Negative for chest pain, palpitations and leg swelling.  Gastrointestinal: Negative for abdominal pain, anorexia, blood in stool, constipation, diarrhea, heartburn, melena, nausea and vomiting.  Genitourinary: Negative for dysuria, frequency, hematuria and urgency.  Musculoskeletal: Negative for back pain, joint pain, myalgias and neck pain.  Skin: Positive for rash. Negative for nail changes.  Neurological: Negative for dizziness, tingling, sensory change, focal weakness and headaches.  Endo/Heme/Allergies: Negative for environmental allergies and polydipsia. Does not bruise/bleed easily.  Psychiatric/Behavioral: Negative for depression and suicidal ideas. The patient is not nervous/anxious and does not have insomnia.      Objective  Vitals:   05/05/18 0815  BP: 120/64  Pulse: 80  Weight: (!) 303 lb (137.4 kg)  Height: 6' (1.829 m)    Physical Exam  Constitutional: He is oriented to person, place, and time.  HENT:  Head: Normocephalic.  Right Ear: External ear normal.  Left Ear: External ear normal.  Nose: Nose normal.  Mouth/Throat: Oropharynx is clear and moist.  Eyes: Pupils are equal, round, and reactive to light. Conjunctivae and EOM are normal. Right eye exhibits no discharge. Left eye exhibits no discharge. No scleral icterus.  Neck: Normal range of motion. Neck supple. No JVD present. No tracheal deviation present. No thyromegaly present.  Cardiovascular: Normal rate, regular rhythm, normal heart sounds and intact distal pulses. Exam reveals no gallop and no friction rub.  No murmur heard. Pulmonary/Chest: Breath sounds normal. No respiratory distress. He has no wheezes. He has no rales.  Abdominal: Soft. Bowel sounds are normal. He exhibits no mass. There is no hepatosplenomegaly. There is no tenderness. There is no rebound, no guarding and no CVA tenderness.  Musculoskeletal: Normal range of motion. He exhibits no edema or tenderness.  Lymphadenopathy:    He has no  cervical adenopathy.  Neurological: He is alert and oriented to person, place, and time. He has normal strength and normal reflexes. No cranial nerve deficit.  Skin: Skin is warm. Rash noted.      Assessment & Plan  Problem List Items Addressed This Visit    None    Visit Diagnoses    Dermatitis    -  Primary   Acute. facial/upper extremeties. Small papular rash . Prednisone taper from 60 mg over 2 weeks. suggest loratadine AM/benedryl PM for pruritis.   Relevant Medications   predniSONE (DELTASONE) 10 MG tablet      Meds ordered this encounter  Medications  . predniSONE (DELTASONE) 10 MG tablet    Sig: Taper 6,6,6,5,5,5,4,4,3,3,2,2,1,1    Dispense:  53 tablet    Refill:  1      Dr. [facility]  05/05/18   

## 2018-08-05 ENCOUNTER — Encounter: Payer: Self-pay | Admitting: Internal Medicine

## 2018-08-05 ENCOUNTER — Ambulatory Visit (INDEPENDENT_AMBULATORY_CARE_PROVIDER_SITE_OTHER): Payer: 59 | Admitting: Internal Medicine

## 2018-08-05 VITALS — BP 136/80 | HR 80 | Temp 98.1°F | Ht 72.0 in | Wt 306.0 lb

## 2018-08-05 DIAGNOSIS — J029 Acute pharyngitis, unspecified: Secondary | ICD-10-CM | POA: Diagnosis not present

## 2018-08-05 DIAGNOSIS — J0100 Acute maxillary sinusitis, unspecified: Secondary | ICD-10-CM | POA: Diagnosis not present

## 2018-08-05 LAB — POCT RAPID STREP A (OFFICE): Rapid Strep A Screen: NEGATIVE

## 2018-08-05 MED ORDER — AMOXICILLIN-POT CLAVULANATE 875-125 MG PO TABS: 1.0000 | ORAL_TABLET | Freq: Two times a day (BID) | ORAL | 0 refills | Status: AC

## 2018-08-05 NOTE — Progress Notes (Signed)
Date:  08/05/2018   Name:  Jason Wu   DOB:  [birth year]   MRN:  [id number]   Chief Complaint: Sore Throat (cough, fever, vomitting- wants flu shot if he can have it)  Sore Throat   This is a new problem. The current episode started in the past 7 days. The problem has been gradually worsening. The maximum temperature recorded prior to his arrival was 100.4 - 100.9 F. The pain is moderate. Associated symptoms include congestion, swollen glands and trouble swallowing. Pertinent negatives include no headaches, hoarse voice or shortness of breath. He has had exposure to strep.    Review of Systems  Constitutional: Positive for chills, fatigue and fever.  HENT: Positive for congestion, sinus pressure, sore throat and trouble swallowing. Negative for hoarse voice.   Respiratory: Negative for chest tightness, shortness of breath and wheezing.   Cardiovascular: Negative for palpitations and leg swelling.  Neurological: Negative for dizziness, numbness and headaches.    Patient Active Problem List   Diagnosis Date Noted  . Insulin resistance 09/22/2017  . Other fatigue 09/22/2017  . Elevated blood pressure reading without diagnosis of hypertension 09/22/2017  . Vitamin D deficiency 09/25/2016  . Class 2 obesity with serious comorbidity and body mass index (BMI) of 37.0 to 37.9 in adult 09/25/2016  . Shortness of breath 09/11/2016    No Known Allergies  History reviewed. No pertinent surgical history.  Social History   Tobacco Use  . Smoking status: Never Smoker  . Smokeless tobacco: Never Used  Substance Use Topics  . Alcohol use: No    Alcohol/week: 0.0 standard drinks  . Drug use: No     Medication list has been reviewed and updated.  Current Meds  Medication Sig  . Vitamin D, Ergocalciferol, (DRISDOL) 50000 units CAPS capsule Take 1 capsule (50,000 Units total) by mouth every 7 (seven) days.    PHQ 2/9 Scores 05/05/2018 09/11/2016 11/19/2015  PHQ - 2 Score 0 2 0    PHQ- 9 Score 0 8 -    Physical Exam  Constitutional: He is oriented to person, place, and time. He appears well-developed and well-nourished.  HENT:  Right Ear: External ear and ear canal normal. Tympanic membrane is not erythematous and not retracted.  Left Ear: External ear and ear canal normal. Tympanic membrane is not erythematous and not retracted.  Nose: Right sinus exhibits maxillary sinus tenderness and frontal sinus tenderness. Left sinus exhibits maxillary sinus tenderness and frontal sinus tenderness.  Mouth/Throat: Uvula is midline and mucous membranes are normal. No oral lesions. Posterior oropharyngeal erythema present. No oropharyngeal exudate.  Cardiovascular: Normal rate, regular rhythm and normal heart sounds.  Pulmonary/Chest: Breath sounds normal. He has no wheezes. He has no rales.  Lymphadenopathy:    He has no cervical adenopathy.  Neurological: He is alert and oriented to person, place, and time.    BP 136/80   Pulse 80   Temp 98.1 F (36.7 C) (Oral)   Ht 6' (1.829 m)   Wt (!) 306 lb (138.8 kg)   SpO2 99%   BMI 41.50 kg/m   Assessment and Plan: 1. Acute non-recurrent maxillary sinusitis Tylenol, robitussin, rest, fluids - amoxicillin-clavulanate (AUGMENTIN) 875-125 MG tablet; Take 1 tablet by mouth 2 (two) times daily for 10 days.  Dispense: 20 tablet; Refill: 0  2. Pharyngitis, unspecified etiology - POCT rapid strep A   Partially dictated using Animal nutritionistDragon software. Any errors are unintentional.  Bari EdwardLaura Peytan Andringa, MD [facility]  08/05/2018

## 2018-08-05 NOTE — Patient Instructions (Signed)
Robitussin every 4-6 hours Tylenol every 6 hours Lots of fluids

## 2018-10-02 ENCOUNTER — Telehealth: Payer: 59 | Admitting: Physician Assistant

## 2018-10-02 ENCOUNTER — Telehealth: Payer: Self-pay | Admitting: Physician Assistant

## 2018-10-02 DIAGNOSIS — J111 Influenza due to unidentified influenza virus with other respiratory manifestations: Secondary | ICD-10-CM

## 2018-10-02 DIAGNOSIS — R69 Illness, unspecified: Secondary | ICD-10-CM

## 2018-10-02 NOTE — Progress Notes (Signed)
E visit for Flu like symptoms   We are sorry that you are not feeling well.  Here is how we plan to help! Based on what you have shared with me it looks like you may have flu-like symptoms that should be watched but do not seem to indicate anti-viral treatment.  Influenza or "the flu" is   an infection caused by a respiratory virus. The flu virus is highly contagious and persons who did not receive their yearly flu vaccination may "catch" the flu from close contact.  We have anti-viral medications to treat the viruses that cause this infection. They are not a "cure" and only shorten the course of the infection. These prescriptions are most effective when they are given within the first 2 days of "flu" symptoms. Antiviral medication are indicated if you have a high risk of complications from the flu. You should also consider an antiviral medication if you are in close contact with someone who is at risk. These medications can help patients avoid complications from the flu  but have side effects that you should know. Possible side effects from Tamiflu or oseltamivir include nausea, vomiting, diarrhea, dizziness, headaches, eye redness, sleep problems or other respiratory symptoms. You should not take Tamiflu if you have an allergy to oseltamivir or any to the ingredients in Tamiflu.  Based upon your symptoms and potential risk factors I recommend that you follow the flu symptoms recommendation that I have listed below.  ANYONE WHO HAS FLU SYMPTOMS SHOULD: Stay home. The flu is highly contagious and going out or to work exposes others!  Be sure to drink plenty of fluids. Water is fine as well as fruit juices, sodas and electrolyte beverages. You may want to stay away from caffeine or alcohol. If you are nauseated, try taking small sips of liquids. How do you know if you are getting enough fluid? Your urine should be a pale yellow or almost colorless. Get rest. Taking a steamy shower or using a  humidifier may help nasal congestion and ease sore throat pain. Using a saline nasal spray works much the same way. Cough drops, hard candies and sore throat lozenges may ease your cough. Line up a caregiver. Have someone check on you regularly. Take 400-600 mg of ibuprofen every 6-8 hours for fever and pain relief.   GET HELP RIGHT AWAY IF: You cannot keep down liquids or your medications. You become short of breath Your fell like you are going to pass out or loose consciousness. Your symptoms persist after you have completed your treatment plan MAKE SURE YOU  Understand these instructions. Will watch your condition. Will get help right away if you are not doing well or get worse.  Your e-visit answers were reviewed by a board certified advanced clinical practitioner to complete your personal care plan.  Depending on the condition, your plan could have included both over the counter or prescription medications.  If there is a problem please reply once you have received a response from your provider.  Your safety is important to us.  If you have drug allergies check your prescription carefully.    You can use MyChart to ask questions about today's visit, request a non-urgent call back, or ask for a work or school excuse for 24 hours related to this e-Visit. If it has been greater than 24 hours you will need to follow up with your provider, or enter a new e-Visit to address those concerns.  You will get an e-mail in  the next two days asking about your experience.  I hope that your e-visit has been valuable and will speed your recovery. Thank you for using e-visits.   ===View-only below this line===   ----- Message -----    From: Jason Wu    Sent: 10/02/2018 10:45 AM EST      To: E-Visit Mailing List Subject: E-Visit Submission: Flu Like Symptoms  E-Visit Submission: Flu Like Symptoms --------------------------------  Question: How long have you had flu like  symptoms? Answer:   over two days  Question: Are you having any body aches or pain such as Answer:   None  Question: Do you have a cough or sore throat? Answer:   I have both a cough and a sore throat  Question: Are you in close contact with anyone who has similar symptoms ? Answer:   No  Question: Do you have a fever? Answer:   Yes, I have a low fever (lower than 101 degrees)  Question: Describe your sore throat: Answer:   My sore throat feels scratchy every time that I cough it hurts  Question: How long have you had a sore throat? Answer:   2 days  Question: Do you have any tenderness or swelling in your neck? Answer:   No  Question: Are you short of breath? Answer:   No  Question: Do you have constant pain in your chest or abdomen? Answer:   No  Question: Are you able to keep liquids down? Answer:   Yes  Question: Have you experienced a seizure or loss of consciousness? Answer:   No  Question: Do you have a headache? Answer:   Yes  Question: Is there a rash? Answer:   No  Question: Are you over the age of 46? Answer:   No  Question: Are you treated for any of the following conditions: Asthma, COPD, diabetes, renal failure on dialysis, AIDS, any neuromuscular disease that effects the clearing of secretions heart failure or heart disease? Answer:   No  Question: Have you received recent chemotherapy or are you taking a drug that may reduce your ability to fight infections for psoriasis, arthritis, or any other autoimmune condition? Answer:   No  Question: Do you have close contact with anyone who has been diagnosed with any of the conditions listed above? Answer:   No  Question: Are your symptoms severe enough that you think or someone has told you that you need to see a physician urgently? Answer:   No  Question: Are you allergic to Zanamivir or Oseltamivir (Tamiflu)? Answer:   No  Question: Are there children in your family or household that are under  the age of 5? Answer:   No  Question: Please list your medication allergies that you may have ? (If 'none' , please list as 'none') Answer:   none  Question: Please list any additional comments  Answer:

## 2018-10-04 ENCOUNTER — Encounter: Payer: Self-pay | Admitting: Family Medicine

## 2018-10-04 ENCOUNTER — Ambulatory Visit (INDEPENDENT_AMBULATORY_CARE_PROVIDER_SITE_OTHER): Payer: 59 | Admitting: Family Medicine

## 2018-10-04 VITALS — BP 120/80 | HR 80 | Temp 98.5°F | Ht 73.0 in | Wt 305.0 lb

## 2018-10-04 DIAGNOSIS — J0100 Acute maxillary sinusitis, unspecified: Secondary | ICD-10-CM | POA: Diagnosis not present

## 2018-10-04 MED ORDER — AMOXICILLIN 500 MG PO CAPS
500.0000 mg | ORAL_CAPSULE | Freq: Three times a day (TID) | ORAL | 0 refills | Status: DC
Start: 2018-10-04 — End: 2019-01-06

## 2018-10-04 NOTE — Progress Notes (Signed)
Date:  10/04/2018   Name:  Jason Wu   DOB:  Mar 01, 1996   MRN:  [id number]   Chief Complaint: Sinusitis (headache, sore throat, fever yesterday)  Sinusitis  This is a new problem. The current episode started in the past 7 days (wednesday). The problem has been gradually worsening since onset. The maximum temperature recorded prior to his arrival was 102 - 102.9 F. The fever has been present for less than 1 day. His pain is at a severity of 4/10. The pain is mild. Associated symptoms include congestion, coughing, diaphoresis, headaches, a hoarse voice, sinus pressure, sneezing, a sore throat and swollen glands. Pertinent negatives include no chills, ear pain, neck pain or shortness of breath. Past treatments include oral decongestants. The treatment provided moderate relief.    Review of Systems  Constitutional: Positive for diaphoresis. Negative for chills and fever.  HENT: Positive for congestion, hoarse voice, sinus pressure, sneezing and sore throat. Negative for drooling, ear discharge, ear pain, mouth sores, postnasal drip, rhinorrhea and sinus pain.   Respiratory: Positive for cough. Negative for choking, chest tightness, shortness of breath and wheezing.   Cardiovascular: Negative for chest pain, palpitations and leg swelling.  Gastrointestinal: Negative for abdominal pain, blood in stool, constipation, diarrhea and nausea.  Endocrine: Negative for polydipsia.  Genitourinary: Negative for dysuria, frequency, hematuria and urgency.  Musculoskeletal: Negative for back pain, myalgias and neck pain.  Skin: Negative for rash.  Allergic/Immunologic: Negative for environmental allergies.  Neurological: Positive for headaches. Negative for dizziness.  Hematological: Does not bruise/bleed easily.  Psychiatric/Behavioral: Negative for suicidal ideas. The patient is not nervous/anxious.     Patient Active Problem List   Diagnosis Date Noted  . Insulin resistance 09/22/2017  . Other  fatigue 09/22/2017  . Elevated blood pressure reading without diagnosis of hypertension 09/22/2017  . Vitamin D deficiency 09/25/2016  . Class 2 obesity with serious comorbidity and body mass index (BMI) of 37.0 to 37.9 in adult 09/25/2016  . Shortness of breath 09/11/2016    No Known Allergies  No past surgical history on file.  Social History   Tobacco Use  . Smoking status: Never Smoker  . Smokeless tobacco: Never Used  Substance Use Topics  . Alcohol use: No    Alcohol/week: 0.0 standard drinks  . Drug use: No     Medication list has been reviewed and updated.  No outpatient medications have been marked as taking for the 10/04/18 encounter (Office Visit) with Duanne Limerick, MD.    [facility] 2/9 Scores 05/05/2018 09/11/2016 11/19/2015  PHQ - 2 Score 0 2 0  PHQ- 9 Score 0 8 -    Physical Exam Vitals signs and nursing note reviewed.  HENT:     Head: Normocephalic.     Jaw: There is normal jaw occlusion.     Right Ear: Hearing, tympanic membrane, ear canal and external ear normal.     Left Ear: Hearing, tympanic membrane, ear canal and external ear normal.     Nose:     Right Nostril: No epistaxis.     Left Nostril: No epistaxis.     Right Turbinates: Enlarged. Not swollen.     Left Turbinates: Enlarged.     Right Sinus: Maxillary sinus tenderness present. No frontal sinus tenderness.     Left Sinus: Maxillary sinus tenderness present. No frontal sinus tenderness.     Mouth/Throat:     Pharynx: Oropharynx is clear. Uvula midline.  Eyes:     General:  No scleral icterus.       Right eye: No discharge.        Left eye: No discharge.     Conjunctiva/sclera: Conjunctivae normal.     Pupils: Pupils are equal, round, and reactive to light.  Neck:     Musculoskeletal: Normal range of motion and neck supple.     Thyroid: No thyromegaly.     Vascular: No JVD.     Trachea: No tracheal deviation.  Cardiovascular:     Rate and Rhythm: Normal rate and regular rhythm.     Heart  sounds: Normal heart sounds. No murmur. No friction rub. No gallop.   Pulmonary:     Effort: No respiratory distress.     Breath sounds: Normal breath sounds. No wheezing or rales.  Abdominal:     General: Bowel sounds are normal.     Palpations: Abdomen is soft. There is no mass.     Tenderness: There is no abdominal tenderness. There is no guarding or rebound.  Musculoskeletal: Normal range of motion.        General: No tenderness.  Lymphadenopathy:     Cervical: No cervical adenopathy.  Skin:    General: Skin is warm.     Findings: No rash.  Neurological:     Mental Status: He is alert and oriented to person, place, and time.     Cranial Nerves: No cranial nerve deficit.     Deep Tendon Reflexes: Reflexes are normal and symmetric.     BP 120/80   Pulse 80   Temp 98.5 F (36.9 C) (Oral)   Ht 6\' 1"  (1.854 m)   Wt (!) 305 lb (138.3 kg)   BMI 40.24 kg/m   Assessment and Plan: 1. Acute maxillary sinusitis, recurrence not specified Acute.  Her sx persistent.  She had amoxicillin 500 mg 3 times a day for 10 days.- amoxicillin (AMOXIL) 500 MG capsule; Take 1 capsule (500 mg total) by mouth 3 (three) times daily.  Dispense: 30 capsule; Refill: 0

## 2019-01-05 ENCOUNTER — Telehealth: Payer: Self-pay | Admitting: Plastic Surgery

## 2019-01-05 NOTE — Telephone Encounter (Signed)
Called patient to confirm appointment scheduled for tomorrow. Patient answered the following questions: °1.Has the patient traveled outside of the state of [place] at all within the past 6 weeks? No °2.Does the patient have a fever or cough at all? No °3.Has the patient been tested for COVID? Had a positive COVID test? No °4. Has the patient been in contact with anyone who has tested positive? No ° °

## 2019-01-06 ENCOUNTER — Other Ambulatory Visit: Payer: Self-pay

## 2019-01-06 ENCOUNTER — Other Ambulatory Visit
Admission: RE | Admit: 2019-01-06 | Discharge: 2019-01-06 | Disposition: A | Discharge status: Home / Self Care | Payer: 59 | Source: Ambulatory Visit | Attending: Plastic Surgery | Admitting: Plastic Surgery

## 2019-01-06 ENCOUNTER — Encounter: Payer: Self-pay | Admitting: Plastic Surgery

## 2019-01-06 ENCOUNTER — Ambulatory Visit: Payer: 59 | Admitting: Plastic Surgery

## 2019-01-06 VITALS — BP 137/80 | HR 84 | Temp 98.6°F | Ht 74.0 in | Wt 305.4 lb

## 2019-01-06 DIAGNOSIS — N62 Hypertrophy of breast: Secondary | ICD-10-CM | POA: Diagnosis not present

## 2019-01-06 LAB — TSH: TSH: 1.559 u[IU]/mL (ref 0.350–4.500)

## 2019-01-06 NOTE — Progress Notes (Signed)
Patient ID: Jason Wu, male    DOB: [birth year], 23 y.o.   MRN: [id number]   Chief Complaint  Patient presents with  . Advice Only    for gynecomastia  . Breast Problem    The patient is a 23 year old black male here with mom for evaluation of his chest.  The patient states that since he was about 23 years old he has suffered from enlarged breasts he has suffered from bullying and teasing for many years because of this.  He denies any drug or alcohol use or abuse.  There is no family history of gynecomastia.  He has elevated blood pressure and is otherwise in good health.  He is 6 feet 2 inches tall and weighs 305 pounds.  According to the classification he is a type V in which the nipple is below the border of the pectoralis muscle and the breast roll is ptotic and noticeable into the axillary region.  No cervical, inguinal or tested particular masses noted.  It is now stable and does not seem to have increased in the past year.  Diet and exercise has not improved the appearance.  He also complains of some tenderness.  The breast tissue does appear fibrocystic by palpation.  He has a very strong history of breast cancer in multiple family members.  There is no male history of breast cancer known.   Review of Systems  Constitutional: Negative for activity change and appetite change.  HENT: Negative.   Eyes: Negative.   Respiratory: Negative.   Cardiovascular: Negative.  Negative for leg swelling.  Gastrointestinal: Negative.  Negative for abdominal distention and abdominal pain.  Endocrine: Negative.   Genitourinary: Negative.   Musculoskeletal: Negative.  Negative for gait problem.  Skin: Negative for color change and wound.  Neurological: Negative.   Psychiatric/Behavioral: Negative.     Past Medical History:  Diagnosis Date  . Allergy   . Anxiety   . Depression   . GERD (gastroesophageal reflux disease)     History reviewed. No pertinent surgical history.   No  current outpatient medications on file.   Objective:   Vitals:   01/06/19 1102  BP: 137/80  Pulse: 84  Temp: 98.6 F (37 C)  SpO2: 100%    Physical Exam Vitals signs and nursing note reviewed.  Constitutional:      Appearance: Normal appearance.  HENT:     Head: Normocephalic and atraumatic.     Mouth/Throat:     Mouth: Mucous membranes are moist.  Neck:     Musculoskeletal: Normal range of motion.  Cardiovascular:     Rate and Rhythm: Normal rate.     Pulses: Normal pulses.  Pulmonary:     Effort: Pulmonary effort is normal. No respiratory distress.  Abdominal:     General: Abdomen is flat. There is no distension.     Tenderness: There is no abdominal tenderness.  Skin:    General: Skin is warm.  Neurological:     General: No focal deficit present.     Mental Status: He is alert and oriented to person, place, and time.  Psychiatric:        Mood and Affect: Mood normal.        Behavior: Behavior normal.     Assessment & Plan:  Gynecomastia  We will do lab work and mammogram to be sure there is no underlying cause.  If all that is negative the following plan is recommended: Bilateral gynecomastia correction with  excision and liposuction.  The patient is to call once he receives all his test results. Pictures taken and placed in chart with patient permission   Alena Bills Aubri Gathright, DO

## 2019-01-07 LAB — TESTOSTERONE,FREE AND TOTAL
Testosterone, Free: 13.6 pg/mL (ref 9.3–26.5)
Testosterone: 389 ng/dL (ref 264–916)

## 2019-01-07 LAB — MISC LABCORP TEST (SEND OUT)
LabCorp test name: 1
Labcorp test code: 10363

## 2019-01-07 LAB — PROLACTIN: Prolactin: 9.1 ng/mL (ref 4.0–15.2)

## 2019-01-07 LAB — T3, FREE: T3, Free: 2.8 pg/mL (ref 2.0–4.4)

## 2019-01-07 LAB — ESTRADIOL: Estradiol: 33.5 pg/mL (ref 7.6–42.6)

## 2019-01-10 ENCOUNTER — Other Ambulatory Visit: Payer: Self-pay | Admitting: Plastic Surgery

## 2019-01-10 DIAGNOSIS — N62 Hypertrophy of breast: Secondary | ICD-10-CM

## 2019-01-10 LAB — MISC LABCORP TEST (SEND OUT): Labcorp test code: 500142

## 2019-01-10 NOTE — Addendum Note (Signed)
Addended by: Peggye Form on: 01/10/2019 11:52 AM   Modules accepted: Orders

## 2019-01-12 ENCOUNTER — Ambulatory Visit
Admission: RE | Admit: 2019-01-12 | Discharge: 2019-01-12 | Disposition: A | Discharge status: Home / Self Care | Payer: 59 | Source: Ambulatory Visit | Attending: Plastic Surgery | Admitting: Plastic Surgery

## 2019-01-12 ENCOUNTER — Other Ambulatory Visit: Payer: Self-pay

## 2019-01-12 DIAGNOSIS — N62 Hypertrophy of breast: Secondary | ICD-10-CM | POA: Insufficient documentation

## 2019-01-12 DIAGNOSIS — R928 Other abnormal and inconclusive findings on diagnostic imaging of breast: Secondary | ICD-10-CM | POA: Diagnosis not present

## 2019-01-31 DIAGNOSIS — L730 Acne keloid: Secondary | ICD-10-CM | POA: Diagnosis not present

## 2019-02-03 DIAGNOSIS — F331 Major depressive disorder, recurrent, moderate: Secondary | ICD-10-CM | POA: Diagnosis not present

## 2019-02-03 DIAGNOSIS — F411 Generalized anxiety disorder: Secondary | ICD-10-CM | POA: Diagnosis not present

## 2019-04-27 ENCOUNTER — Ambulatory Visit (INDEPENDENT_AMBULATORY_CARE_PROVIDER_SITE_OTHER): Payer: 59 | Admitting: Family Medicine

## 2019-04-27 ENCOUNTER — Other Ambulatory Visit: Payer: Self-pay

## 2019-04-27 ENCOUNTER — Encounter: Payer: Self-pay | Admitting: Family Medicine

## 2019-04-27 VITALS — Temp 100.1°F | Ht 75.0 in | Wt 305.0 lb

## 2019-04-27 DIAGNOSIS — U071 COVID-19: Secondary | ICD-10-CM | POA: Diagnosis not present

## 2019-04-27 DIAGNOSIS — Z20822 Contact with and (suspected) exposure to covid-19: Secondary | ICD-10-CM

## 2019-04-27 DIAGNOSIS — J988 Other specified respiratory disorders: Secondary | ICD-10-CM | POA: Diagnosis not present

## 2019-04-27 NOTE — Progress Notes (Signed)
Date:  04/27/2019   Name:  Jason Wu   DOB:  1995-09-13   MRN:  [id number]   Chief Complaint: Fever (started yesterday with 100.3, has diarrhea and headache- taking Ibuprofen)  I connected withthis patient, Jason Wu, by telephoneat the patient's home.  I verified that I am speaking with the correct person using two identifiers. This visit was conducted via telephone due to the Covid-19 outbreak from my office at [facility] in [place], [place]. I discussed the limitations, risks, security and privacy concerns of performing an evaluation and management service by telephone. I also discussed with the patient that there may be a patient responsible charge related to this service. The patient expressed understanding and agreed to proceed.  Fever  This is a new problem. The current episode started in the past 7 days (2 days). The problem occurs constantly. The problem has been waxing and waning. The temperature was taken using an oral thermometer (100.5). Associated symptoms include coughing, diarrhea, headaches, muscle aches, nausea and sleepiness. Pertinent negatives include no abdominal pain, chest pain, congestion, ear pain, rash, sore throat, urinary pain, vomiting or wheezing. He has tried acetaminophen and NSAIDs for the symptoms. The treatment provided mild relief.  Risk factors: no sick contacts     Review of Systems  Constitutional: Positive for chills, diaphoresis and fever.  HENT: Negative for congestion, drooling, ear discharge, ear pain and sore throat.   Respiratory: Positive for cough. Negative for shortness of breath and wheezing.   Cardiovascular: Negative for chest pain, palpitations and leg swelling.  Gastrointestinal: Positive for diarrhea and nausea. Negative for abdominal pain, blood in stool, constipation and vomiting.  Endocrine: Negative for polydipsia.  Genitourinary: Negative for dysuria, frequency, hematuria and urgency.  Musculoskeletal: Negative  for back pain, myalgias and neck pain.  Skin: Negative for rash.  Allergic/Immunologic: Negative for environmental allergies.  Neurological: Positive for headaches. Negative for dizziness.  Hematological: Does not bruise/bleed easily.  Psychiatric/Behavioral: Negative for suicidal ideas. The patient is not nervous/anxious.     Patient Active Problem List   Diagnosis Date Noted  . Gynecomastia 01/06/2019  . Insulin resistance 09/22/2017  . Other fatigue 09/22/2017  . Elevated blood pressure reading without diagnosis of hypertension 09/22/2017  . Vitamin D deficiency 09/25/2016  . Class 2 obesity with serious comorbidity and body mass index (BMI) of 37.0 to 37.9 in adult 09/25/2016  . Shortness of breath 09/11/2016    No Known Allergies  No past surgical history on file.  Social History   Tobacco Use  . Smoking status: Never Smoker  . Smokeless tobacco: Never Used  Substance Use Topics  . Alcohol use: No    Alcohol/week: 0.0 standard drinks  . Drug use: No     Medication list has been reviewed and updated.  No outpatient medications have been marked as taking for the 04/27/19 encounter (Office Visit) with Juline Patch, MD.    [facility] 2/9 Scores 04/27/2019 05/05/2018 09/11/2016 11/19/2015  PHQ - 2 Score 0 0 2 0  PHQ- 9 Score 1 0 8 -    BP Readings from Last 3 Encounters:  01/06/19 137/80  10/04/18 120/80  08/05/18 136/80    Physical Exam Vitals signs and nursing note reviewed.  HENT:     Head: Normocephalic.     Right Ear: External ear normal.     Left Ear: External ear normal.     Nose: Nose normal.  Eyes:     General: No scleral icterus.  Right eye: No discharge.        Left eye: No discharge.     Conjunctiva/sclera: Conjunctivae normal.     Pupils: Pupils are equal, round, and reactive to light.  Neck:     Musculoskeletal: Normal range of motion and neck supple.     Thyroid: No thyromegaly.     Vascular: No JVD.     Trachea: No tracheal deviation.   Cardiovascular:     Rate and Rhythm: Normal rate and regular rhythm.     Heart sounds: Normal heart sounds. No murmur. No friction rub. No gallop.   Pulmonary:     Effort: No respiratory distress.     Breath sounds: Normal breath sounds. No wheezing or rales.  Abdominal:     General: Bowel sounds are normal.     Palpations: Abdomen is soft. There is no mass.     Tenderness: There is no abdominal tenderness. There is no guarding or rebound.  Musculoskeletal: Normal range of motion.        General: No tenderness.  Lymphadenopathy:     Cervical: No cervical adenopathy.  Skin:    General: Skin is warm.     Findings: No rash.  Neurological:     Mental Status: He is alert and oriented to person, place, and time.     Cranial Nerves: No cranial nerve deficit.     Deep Tendon Reflexes: Reflexes are normal and symmetric.     Wt Readings from Last 3 Encounters:  04/27/19 (!) 305 lb (138.3 kg)  01/06/19 (!) 305 lb 6.4 oz (138.5 kg)  10/04/18 (!) 305 lb (138.3 kg)    Temp 100.1 F (37.8 C) (Oral)   Ht 6\' 3"  (1.905 m)   Wt (!) 305 lb (138.3 kg)   BMI 38.12 kg/m   Assessment and Plan:  1. COVID-19 This is screening for COVID illness for symptoms of fever cough diarrhea chills of 48 hours duration.  Patient will undergo testing for COVID in the meantime we have reviewed the precautions for isolation and patient is to be isolated for the next 4 to 5 days until results of the test have been obtained and have been given to the patient.  Patient will notify his occupation of his circumstance.  Patient has been told that if symptoms should worsen over the course of the illness that he is to contact medical personnel immediately and seek medical help in emergency room if things should deteriorate quickly. - Novel Coronavirus, [facility] ([facility])

## 2019-04-27 NOTE — Patient Instructions (Signed)
This information is directly available on the CDC website: https://www.cdc.gov/coronavirus/2019-ncov/if-you-are-sick/steps-when-sick.html    Source:CDC Reference to specific commercial products, manufacturers, companies, or trademarks does not constitute its endorsement or recommendation by the U.S. Government, Department of Health and Human Services, or Centers for Disease Control and Prevention.  

## 2019-04-28 LAB — NOVEL CORONAVIRUS, NAA: SARS-CoV-2, NAA: NOT DETECTED

## 2019-04-28 LAB — SPECIMEN STATUS REPORT

## 2019-06-13 ENCOUNTER — Other Ambulatory Visit: Payer: Self-pay

## 2019-06-13 ENCOUNTER — Ambulatory Visit
Admission: EM | Admit: 2019-06-13 | Discharge: 2019-06-13 | Disposition: A | Discharge status: Home / Self Care | Payer: 59 | Attending: Family Medicine | Admitting: Family Medicine

## 2019-06-13 ENCOUNTER — Encounter: Payer: Self-pay | Admitting: Emergency Medicine

## 2019-06-13 DIAGNOSIS — R079 Chest pain, unspecified: Secondary | ICD-10-CM | POA: Diagnosis not present

## 2019-06-13 DIAGNOSIS — T7840XA Allergy, unspecified, initial encounter: Secondary | ICD-10-CM

## 2019-06-13 DIAGNOSIS — R070 Pain in throat: Secondary | ICD-10-CM | POA: Diagnosis not present

## 2019-06-13 MED ORDER — FAMOTIDINE 20 MG PO TABS
20.0000 mg | ORAL_TABLET | Freq: Once | ORAL | Status: AC
  Administered 2019-06-13: 17:00:00 20 mg via ORAL

## 2019-06-13 MED ORDER — PREDNISONE 50 MG PO TABS
60.0000 mg | ORAL_TABLET | Freq: Once | ORAL | Status: AC
  Administered 2019-06-13: 60 mg via ORAL

## 2019-06-13 MED ORDER — PREDNISONE 20 MG PO TABS: 40.0000 mg | ORAL_TABLET | Freq: Every day | ORAL | 0 refills | Status: DC

## 2019-06-13 MED ORDER — DIPHENHYDRAMINE HCL 50 MG PO CAPS
50.0000 mg | ORAL_CAPSULE | Freq: Once | ORAL | Status: AC
  Administered 2019-06-13: 50 mg via ORAL

## 2019-06-13 MED ORDER — FAMOTIDINE 20 MG PO TABS: 20.0000 mg | ORAL_TABLET | Freq: Two times a day (BID) | ORAL | 0 refills | Status: DC

## 2019-06-13 NOTE — ED Triage Notes (Signed)
Pt here for possible allergic reaction. Was at work at [employer] and was putting on a new candy of coconut and then began to have tightness in your chest, abdominal pain and nausea along with scratchy throat. Didn't have a problem with this before that he knows of. Still having symptoms currently.

## 2019-06-13 NOTE — ED Provider Notes (Signed)
[facility] ____________________________________________  Time seen: Approximately 4:28 PM  I have reviewed the triage vital signs and the nursing notes.   HISTORY  Chief Complaint Allergic Reaction   HPI Jason Wu is a 23 y.o. male presenting for evaluation of possible allergic reaction.  Patient reports in the past any time he has had contact with coconut flavoring or coconut weather by smelling large amounts or by eating he would have reaction.  States his previous reaction included a chest tightness, throat scratchiness sensation.  States that he works at [employer] and this afternoon another coworker was not present so he needed to get a candy flavoring started.  States it was poor pia colada and the coconut block was what he started.  States within a couple of minutes of starting the coconut flavoring after touching it he began to feel chest tightness and throat scratching sensation.  Denies any current shortness of breath or actual chest pain.  No difficulty swallowing or clearing airway.  States he had a slight lip swelling initially but that has since resolved.  States this started around 2 PM this afternoon.  Denies Worker's Compensation injury.  Reports otherwise doing well.  No recent cough or sickness.  Denies medication or other contact changes.  States he never sought medical care for previous reactions.  Juline Patch, MD: PCP   Past Medical History:  Diagnosis Date  . Allergy   . Anxiety   . Depression   . GERD (gastroesophageal reflux disease)     Patient Active Problem List   Diagnosis Date Noted  . Gynecomastia 01/06/2019  . Insulin resistance 09/22/2017  . Other fatigue 09/22/2017  . Elevated blood pressure reading without diagnosis of hypertension 09/22/2017  . Vitamin D deficiency 09/25/2016  . Class 2 obesity with serious comorbidity and body mass index (BMI) of 37.0 to 37.9 in adult 09/25/2016  . Shortness of breath 09/11/2016     History reviewed. No pertinent surgical history.   No current facility-administered medications for this encounter.   Current Outpatient Medications:  .  cholecalciferol (VITAMIN D3) 25 MCG (1000 UT) tablet, Take 1,000 Units by mouth daily., Disp: , Rfl:  .  famotidine (PEPCID) 20 MG tablet, Take 1 tablet (20 mg total) by mouth 2 (two) times daily for 5 days., Disp: 10 tablet, Rfl: 0 .  predniSONE (DELTASONE) 20 MG tablet, Take 2 tablets (40 mg total) by mouth daily., Disp: 10 tablet, Rfl: 0  Allergies Patient has no known allergies.  Family History  Problem Relation Age of Onset  . Hyperlipidemia Mother   . Depression Mother   . Anxiety disorder Mother   . Obesity Mother   . Hypertension Father   . Breast cancer Paternal Aunt   . Breast cancer Maternal Grandmother     Social History Social History   Tobacco Use  . Smoking status: Never Smoker  . Smokeless tobacco: Never Used  Substance Use Topics  . Alcohol use: No    Alcohol/week: 0.0 standard drinks  . Drug use: No    Review of Systems Constitutional: No fever ENT: No sore throat. AS above.  Cardiovascular: Denies chest pain. Respiratory: Denies shortness of breath. Gastrointestinal: No abdominal pain.  Musculoskeletal: Negative for back pain. Skin: Negative for rash.  ____________________________________________   PHYSICAL EXAM:  VITAL SIGNS: ED Triage Vitals  Enc Vitals Group     BP 06/13/19 1602 119/84     Pulse Rate 06/13/19 1602 76     Resp  06/13/19 1602 17     Temp 06/13/19 1602 99.2 F (37.3 C)     Temp Source 06/13/19 1602 Oral     SpO2 06/13/19 1602 96 %     Weight 06/13/19 1600 297 lb (134.7 kg)     Height 06/13/19 1600 6\' 3"  (1.905 m)     Head Circumference --      Peak Flow --      Pain Score 06/13/19 1600 7     Pain Loc --      Pain Edu? --      Excl. in GC? --     Constitutional: Alert and oriented. Well appearing and in no acute distress. Eyes: Conjunctivae are normal.  ENT       Head: Normocephalic and atraumatic.   Mouth/Throat: Mucous membranes are moist. No pharyngeal erythema. No tonsillar swelling or exudate.  No lip, tongue or oral pharyngeal edema noted.  Speech is clear. Neck: No stridor.  No cervical spine tenderness to palpation. Hematological/Lymphatic/Immunilogical: No cervical lymphadenopathy.  Cardiovascular: Normal rate, regular rhythm. Grossly normal heart sounds.  Good peripheral circulation. Respiratory: Normal respiratory effort without tachypnea nor retractions. Breath sounds are clear and equal bilaterally. No wheezes, rales, rhonchi. Musculoskeletal: Steady gait.  Neurologic:  Normal speech and language. Speech is normal. No gait instability.  Skin:  Skin is warm, dry and intact. No rash noted. Psychiatric: Mood and affect are normal. Speech and behavior are normal. Patient exhibits appropriate insight and judgment   ___________________________________________   LABS (all labs ordered are listed, but only abnormal results are displayed)    PROCEDURES Procedures    INITIAL IMPRESSION / ASSESSMENT AND PLAN / ED COURSE  Pertinent labs & imaging results that were available during my care of the patient were reviewed by me and considered in my medical decision making (see chart for details).  Well-appearing patient.  No acute distress.  Suspect acute allergic reaction second to coconut flavoring.  Patient appears well..  Been no medication taken prior to arrival.  50 mg Benadryl, 20 mg Pepcid and 60 mg prednisone given once in urgent care.  Monitoring.  After medications, patient reevaluated and reports feeling much better.  Reports decreased to throat scratchy sensation as well as chest tightness.  Denies any pain or shortness of breath this time.  Will treat with prednisone, Pepcid and over-the-counter Benadryl.  Monitor discussed very strict follow-up and return parameters.  Avoid coconut.Discussed indication, risks and benefits of  medications with patient.  Discussed follow up with Primary care physician this week. Discussed follow up and return parameters including no resolution or any worsening concerns. Patient verbalized understanding and agreed to plan.   ____________________________________________   FINAL CLINICAL IMPRESSION(S) / ED DIAGNOSES  Final diagnoses:  Allergic reaction, initial encounter     ED Discharge Orders         Ordered    predniSONE (DELTASONE) 20 MG tablet  Daily     06/13/19 1712    famotidine (PEPCID) 20 MG tablet  2 times daily     06/13/19 1712           Note: This dictation was prepared with Dragon dictation along with smaller phrase technology. Any transcriptional errors that result from this process are unintentional.         08/13/19, NP 06/13/19 1811

## 2019-06-13 NOTE — Discharge Instructions (Signed)
Take medication as prescribed.  Over-the-counter Benadryl as discussed.  Rest. Drink plenty of fluids.  AVOID coconut.  Monitor.  Follow up with your primary care physician this week as needed. Return to Urgent care or emergency room for new or worsening concerns.

## 2019-08-11 ENCOUNTER — Other Ambulatory Visit: Payer: Self-pay

## 2019-08-11 DIAGNOSIS — Z20822 Contact with and (suspected) exposure to covid-19: Secondary | ICD-10-CM

## 2019-08-13 LAB — NOVEL CORONAVIRUS, NAA: SARS-CoV-2, NAA: NOT DETECTED

## 2019-09-13 ENCOUNTER — Telehealth: Payer: 59 | Admitting: Physician Assistant

## 2019-09-13 DIAGNOSIS — Z20822 Contact with and (suspected) exposure to covid-19: Secondary | ICD-10-CM

## 2019-09-13 NOTE — Progress Notes (Signed)
E-Visit for Corona Virus Screening   Your current symptoms could be consistent with the coronavirus.  Many health care providers can now test patients at their office but not all are.  [facility] has multiple testing sites. For information on our COVID testing locations and hours go to [email or web address]  We are enrolling you in our MyChart Home Monitoring for COVID19 . Daily you will receive a questionnaire within the MyChart website. Our COVID 19 response team will be monitoring your responses daily.  Testing Information: The COVID-19 Community Testing sites will begin testing BY APPOINTMENT ONLY.  You can schedule online at [email or web address]  If you do not have access to a smart phone or computer you may call [phone] for an appointment.  Testing Locations: Appointment schedule is 8 am to 3:30 pm at all sites   indoors at [address], [place] [address] [facility]  indoors at [address] Visitors Entrance, [place], [place] [address] [place] indoors at [address], [place] [address]  Additional testing sites in the Community:  . For [facility] Testing sites in [place]  https://www.cvs.com/minuteclinic/covid-19-testing  . For Pop-up testing sites in [place]  https://covid19.ncdhhs.gov/about-covid-19/testing/find-my-testing-place/pop-testing-sites  . For Testing sites with regular hours https://onsms.org/[place]/  . For [place] [facility] https://[facility].com/covid-19-community-outreach-testing/  . For [facility] https://www.[place].gov[place]/human-services/health-department/coronavirus-covid-19-info/covid-19-testing  . For [place] testing in [place] https://www.[place].gov/[place]/human-services/health-department/coronavirus-covid-19-info/covid-19-testing  . For [facility] testing in [place]   https://[facility].care/covidtesting  For  more  information about community testing call [phone]   We are enrolling you in our MyChart Home Monitoring for COVID19 . Daily you will receive a questionnaire within the MyChart website. Our COVID 19 response team will be monitoring your responses daily.  Please quarantine yourself while awaiting your test results. If you develop fever/cough/breathlessness, please stay home for 10 days with improving symptoms and until you have had 24 hours of no fever (without taking a fever reducer).  You should wear a mask or cloth face covering over your nose and mouth if you must be around other people or animals, including pets (even at home). Try to stay at least 6 feet away from other people. This will protect the people around you.  Please continue good preventive care measures, including:  frequent hand-washing, avoid touching your face, cover coughs/sneezes, stay out of crowds and keep a 6 foot distance from others.  COVID-19 is a respiratory illness with symptoms that are similar to the flu. Symptoms are typically mild to moderate, but there have been cases of severe illness and death due to the virus.   The following symptoms may appear 2-14 days after exposure: . Fever . Cough . Shortness of breath or difficulty breathing . Chills . Repeated shaking with chills . Muscle pain . Headache . Sore throat . New loss of taste or smell . Fatigue . Congestion or runny nose . Nausea or vomiting . Diarrhea  Go to the nearest hospital ED for assessment if fever/cough/breathlessness are severe or illness seems like a threat to life.  It is vitally important that if you feel that you have an infection such as this virus or any other virus that you stay home and away from places where you may spread it to others.  You should avoid contact with people age 65 and older.    You may also take acetaminophen (Tylenol) as needed for fever.  Reduce your risk of any infection by using the same precautions used for    avoiding the common cold or flu:  [place] Kitchen Wash your hands often with soap and warm water for at least 20 seconds.  If soap and water are not readily available, use an alcohol-based hand sanitizer with at least 60% alcohol.  . If coughing or sneezing, cover your mouth and nose by coughing or sneezing into the elbow areas of your shirt or coat, into a tissue or into your sleeve (not your hands). . Avoid shaking hands with others and consider head nods or verbal greetings only. . Avoid touching your eyes, nose, or mouth with unwashed hands.  . Avoid close contact with people who are sick. . Avoid places or events with large numbers of people in one location, like concerts or sporting events. . Carefully consider travel plans you have or are making. . If you are planning any travel outside or inside the [place], visit the CDC's Travelers' Health webpage for the latest health notices. . If you have some symptoms but not all symptoms, continue to monitor at home and seek medical attention if your symptoms worsen. . If you are having a medical emergency, call 911.  HOME CARE . Only take medications as instructed by your medical team. . Drink plenty of fluids and get plenty of rest. . A steam or ultrasonic humidifier can help if you have congestion.   GET HELP RIGHT AWAY IF YOU HAVE EMERGENCY WARNING SIGNS** FOR COVID-19. If you or someone is showing any of these signs seek emergency medical care immediately. Call 911 or proceed to your closest emergency facility if: . You develop worsening high fever. . Trouble breathing . Bluish lips or face . Persistent pain or pressure in the chest . New confusion . Inability to wake or stay awake . You cough up blood. . Your symptoms become more severe  **This list is not all possible symptoms. Contact your medical provider for any symptoms that are sever or concerning to you.  MAKE SURE YOU   Understand these instructions.  Will watch your condition.  Will get  help right away if you are not doing well or get worse.  Your e-visit answers were reviewed by a board certified advanced clinical practitioner to complete your personal care plan.  Depending on the condition, your plan could have included both over the counter or prescription medications.  If there is a problem please reply once you have received a response from your provider.  Your safety is important to [place].  If you have drug allergies check your prescription carefully.    You can use MyChart to ask questions about today's visit, request a non-urgent call back, or ask for a work or school excuse for 24 hours related to this e-Visit. If it has been greater than 24 hours you will need to follow up with your provider, or enter a new e-Visit to address those concerns. You will get an e-mail in the next two days asking about your experience.  I hope that your e-visit has been valuable and will speed your recovery. Thank you for using e-visits.   5 minutes spent on this chart

## 2019-09-14 ENCOUNTER — Other Ambulatory Visit: Payer: 59

## 2019-09-14 ENCOUNTER — Ambulatory Visit (INDEPENDENT_AMBULATORY_CARE_PROVIDER_SITE_OTHER): Payer: 59 | Admitting: Family Medicine

## 2019-09-14 ENCOUNTER — Encounter: Payer: Self-pay | Admitting: Family Medicine

## 2019-09-14 VITALS — Temp 99.3°F

## 2019-09-14 DIAGNOSIS — J0100 Acute maxillary sinusitis, unspecified: Secondary | ICD-10-CM

## 2019-09-14 MED ORDER — AMOXICILLIN-POT CLAVULANATE 875-125 MG PO TABS: 1.0000 | ORAL_TABLET | Freq: Two times a day (BID) | ORAL | 0 refills | Status: DC

## 2019-09-14 MED ORDER — GUAIFENESIN-CODEINE 100-10 MG/5ML PO SYRP: 5.0000 mL | ORAL_SOLUTION | Freq: Four times a day (QID) | ORAL | 0 refills | Status: DC | PRN

## 2019-09-14 NOTE — Progress Notes (Signed)
Date:  09/14/2019   Name:  Jason Wu   DOB:  Feb 23, 1996   MRN:  [id number]   Chief Complaint: Sore Throat (yesterday had fever of 100.4, sore throat, cough and headache. )  I connected withthis patient, Jason Wu, by telephoneat the patient's home.  I verified that I am speaking with the correct person using two identifiers. This visit was conducted via telephone due to the Covid-19 outbreak from my office at [facility] in [place], [place]. I discussed the limitations, risks, security and privacy concerns of performing an evaluation and management service by telephone. I also discussed with the patient that there may be a patient responsible charge related to this service. The patient expressed understanding and agreed to proceed.  Sore Throat  This is a new problem. The current episode started in the past 7 days. The pain is mild. Associated symptoms include congestion, coughing, headaches and a hoarse voice. Pertinent negatives include no abdominal pain, diarrhea, drooling, ear discharge, ear pain, neck pain, shortness of breath or swollen glands. Associated symptoms comments: frontal. He has had no exposure to strep or mono. He has tried NSAIDs and acetaminophen for the symptoms. The treatment provided mild relief.  Sinusitis This is a chronic problem. The current episode started more than 1 year ago. The problem has been waxing and waning since onset. There has been no fever. The pain is mild. Associated symptoms include congestion, coughing, headaches and a hoarse voice. Pertinent negatives include no chills, ear pain, neck pain, shortness of breath, sore throat or swollen glands. The treatment provided mild relief.    Lab Results  Component Value Date   CREATININE 0.67 01/15/2018   BUN 8 01/15/2018   NA 138 01/15/2018   K 3.8 01/15/2018   CL 106 01/15/2018   CO2 26 01/15/2018   Lab Results  Component Value Date   CHOL 105 09/29/2017   HDL 21 (L) 09/29/2017    LDLCALC 64 09/29/2017   TRIG 99 (H) 09/29/2017   Lab Results  Component Value Date   TSH 1.559 01/06/2019   Lab Results  Component Value Date   HGBA1C 5.4 09/29/2017     Review of Systems  Constitutional: Negative for chills and fever.  HENT: Positive for congestion and hoarse voice. Negative for drooling, ear discharge, ear pain and sore throat.   Respiratory: Positive for cough. Negative for shortness of breath and wheezing.   Cardiovascular: Negative for chest pain, palpitations and leg swelling.  Gastrointestinal: Negative for abdominal pain, blood in stool, constipation, diarrhea and nausea.  Endocrine: Negative for polydipsia.  Genitourinary: Negative for dysuria, frequency, hematuria and urgency.  Musculoskeletal: Negative for back pain, myalgias and neck pain.  Skin: Negative for rash.  Allergic/Immunologic: Negative for environmental allergies.  Neurological: Positive for headaches. Negative for dizziness.  Hematological: Does not bruise/bleed easily.  Psychiatric/Behavioral: Negative for suicidal ideas. The patient is not nervous/anxious.     Patient Active Problem List   Diagnosis Date Noted  . Gynecomastia 01/06/2019  . Insulin resistance 09/22/2017  . Other fatigue 09/22/2017  . Elevated blood pressure reading without diagnosis of hypertension 09/22/2017  . Vitamin D deficiency 09/25/2016  . Class 2 obesity with serious comorbidity and body mass index (BMI) of 37.0 to 37.9 in adult 09/25/2016  . Shortness of breath 09/11/2016    No Known Allergies  No past surgical history on file.  Social History   Tobacco Use  . Smoking status: Never Smoker  . Smokeless tobacco:  Never Used  Substance Use Topics  . Alcohol use: No    Alcohol/week: 0.0 standard drinks  . Drug use: No     Medication list has been reviewed and updated.  Current Meds  Medication Sig  . cholecalciferol (VITAMIN D3) 25 MCG (1000 UT) tablet Take 1,000 Units by mouth daily.    PHQ  2/9 Scores 09/14/2019 04/27/2019 05/05/2018 09/11/2016  PHQ - 2 Score 0 0 0 2  PHQ- 9 Score 0 1 0 8    BP Readings from Last 3 Encounters:  06/13/19 119/84  01/06/19 137/80  10/04/18 120/80    Physical Exam Vitals reviewed.     Wt Readings from Last 3 Encounters:  06/13/19 297 lb (134.7 kg)  04/27/19 (!) 305 lb (138.3 kg)  01/06/19 (!) 305 lb 6.4 oz (138.5 kg)    Temp 99.3 F (37.4 C) (Oral)   Assessment and Plan: 1. Acute maxillary sinusitis, recurrence not specified Acute.  Persistent.  We will initiate Augmentin 875 mg twice a day.  We will also prescribe Robitussin-AC for persistent cough.  Patient is waiting for results of Covid which were done yesterday.  Patient has been check twice with this particular circumstance and will be awaiting results in the next 48 hours. - amoxicillin-clavulanate (AUGMENTIN) 875-125 MG tablet; Take 1 tablet by mouth 2 (two) times daily.  Dispense: 20 tablet; Refill: 0 - guaiFENesin-codeine (ROBITUSSIN AC) 100-10 MG/5ML syrup; Take 5 mLs by mouth 4 (four) times daily as needed for cough.  Dispense: 118 mL; Refill: 0

## 2019-10-07 ENCOUNTER — Ambulatory Visit (INDEPENDENT_AMBULATORY_CARE_PROVIDER_SITE_OTHER): Payer: 59 | Admitting: Family Medicine

## 2019-10-07 ENCOUNTER — Other Ambulatory Visit: Payer: Self-pay

## 2019-10-07 ENCOUNTER — Encounter: Payer: Self-pay | Admitting: Family Medicine

## 2019-10-07 VITALS — BP 120/80 | HR 64 | Ht 74.0 in | Wt 319.0 lb

## 2019-10-07 DIAGNOSIS — Z9189 Other specified personal risk factors, not elsewhere classified: Secondary | ICD-10-CM | POA: Diagnosis not present

## 2019-10-07 DIAGNOSIS — Z6841 Body Mass Index (BMI) 40.0 and over, adult: Secondary | ICD-10-CM

## 2019-10-07 DIAGNOSIS — Z23 Encounter for immunization: Secondary | ICD-10-CM | POA: Diagnosis not present

## 2019-10-07 NOTE — Progress Notes (Signed)
Date:  10/07/2019   Name:  Jason Wu   DOB:  Apr 26, 1996   MRN:  [id number]   Chief Complaint: Sleep Apnea (face-to- face/ snores, no apneic events witnessed, no daytime sleepiness)  Patient is a 24 year old male who presents for a face to face  Exam for sleep study. The patient reports the following problems: weight gain. Health maintenance has been reviewed need tdap/influenza. Witness snoring.    Lab Results  Component Value Date   CREATININE 0.67 01/15/2018   BUN 8 01/15/2018   NA 138 01/15/2018   K 3.8 01/15/2018   CL 106 01/15/2018   CO2 26 01/15/2018   Lab Results  Component Value Date   CHOL 105 09/29/2017   HDL 21 (L) 09/29/2017   LDLCALC 64 09/29/2017   TRIG 99 (H) 09/29/2017   Lab Results  Component Value Date   TSH 1.559 01/06/2019   Lab Results  Component Value Date   HGBA1C 5.4 09/29/2017     Review of Systems  Constitutional: Negative for chills and fever.  HENT: Negative for drooling, ear discharge, ear pain and sore throat.   Respiratory: Negative for cough, shortness of breath and wheezing.   Cardiovascular: Negative for chest pain, palpitations and leg swelling.  Gastrointestinal: Negative for abdominal pain, blood in stool, constipation, diarrhea and nausea.  Endocrine: Negative for polydipsia.  Genitourinary: Negative for dysuria, frequency, hematuria and urgency.  Musculoskeletal: Negative for back pain, myalgias and neck pain.  Skin: Negative for rash.  Allergic/Immunologic: Negative for environmental allergies.  Neurological: Negative for dizziness and headaches.  Hematological: Does not bruise/bleed easily.  Psychiatric/Behavioral: Negative for suicidal ideas. The patient is not nervous/anxious.     Patient Active Problem List   Diagnosis Date Noted  . Gynecomastia 01/06/2019  . Insulin resistance 09/22/2017  . Other fatigue 09/22/2017  . Elevated blood pressure reading without diagnosis of hypertension 09/22/2017  . Vitamin D  deficiency 09/25/2016  . Class 2 obesity with serious comorbidity and body mass index (BMI) of 37.0 to 37.9 in adult 09/25/2016  . Shortness of breath 09/11/2016    No Known Allergies  No past surgical history on file.  Social History   Tobacco Use  . Smoking status: Never Smoker  . Smokeless tobacco: Never Used  Substance Use Topics  . Alcohol use: No    Alcohol/week: 0.0 standard drinks  . Drug use: No     Medication list has been reviewed and updated.  Current Meds  Medication Sig  . cholecalciferol (VITAMIN D3) 25 MCG (1000 UT) tablet Take 1,000 Units by mouth daily.  . [DISCONTINUED] amoxicillin-clavulanate (AUGMENTIN) 875-125 MG tablet Take 1 tablet by mouth 2 (two) times daily.    PHQ 2/9 Scores 10/07/2019 09/14/2019 04/27/2019 05/05/2018  PHQ - 2 Score 0 0 0 0  PHQ- 9 Score 0 0 1 0    BP Readings from Last 3 Encounters:  10/07/19 120/80  06/13/19 119/84  01/06/19 137/80    Physical Exam Vitals and nursing note reviewed.  HENT:     Head: Normocephalic.     Right Ear: Tympanic membrane, ear canal and external ear normal.     Left Ear: Tympanic membrane, ear canal and external ear normal.     Nose: Nose normal. No congestion or rhinorrhea.     Mouth/Throat:     Mouth: Mucous membranes are moist.  Eyes:     General: No scleral icterus.       Right eye: No discharge.  Left eye: No discharge.     Conjunctiva/sclera: Conjunctivae normal.     Pupils: Pupils are equal, round, and reactive to light.  Neck:     Thyroid: No thyromegaly.     Vascular: No JVD.     Trachea: No tracheal deviation.     Comments: Circumference 16 inches. Cardiovascular:     Rate and Rhythm: Normal rate and regular rhythm.     Heart sounds: Normal heart sounds. No murmur. No friction rub. No gallop.   Pulmonary:     Effort: No respiratory distress.     Breath sounds: Normal breath sounds. No stridor. No wheezing, rhonchi or rales.  Abdominal:     General: Bowel sounds are  normal.     Palpations: Abdomen is soft. There is no mass.     Tenderness: There is no abdominal tenderness. There is no guarding or rebound.  Musculoskeletal:        General: No tenderness. Normal range of motion.     Cervical back: Normal range of motion and neck supple.  Lymphadenopathy:     Cervical: No cervical adenopathy.  Skin:    General: Skin is warm.     Findings: No rash.  Neurological:     Mental Status: He is alert and oriented to person, place, and time.     Cranial Nerves: No cranial nerve deficit.     Deep Tendon Reflexes: Reflexes are normal and symmetric.     Wt Readings from Last 3 Encounters:  10/07/19 (!) 319 lb (144.7 kg)  06/13/19 297 lb (134.7 kg)  04/27/19 (!) 305 lb (138.3 kg)    BP 120/80   Pulse 64   Ht 6\' 2"  (1.88 m)   Wt (!) 319 lb (144.7 kg)   BMI 40.96 kg/m   Assessment and Plan:  1. At risk for obstructive sleep apnea Patient at risk for sleep apnea of an obstructive nature secondary to his significant BMI.  During examination patient is noted to have a neck circumference of 16 inches.  Patient has had witnessed snoring but no witnessed apneic episodes Epworth Sleepiness Scale is at 2.  Because of neck circumference and weight patient will be referred for evaluation for sleep apnea at a sleep clinic.  2. Class 3 severe obesity due to excess calories without serious comorbidity with body mass index (BMI) of 40.0 to 44.9 in adult [facility]) Patient has had a 20 pound weight gain over the past 4 months.  Patient been instructed to have a low-cholesterol diet in the 12 100-15 100 range.

## 2019-10-07 NOTE — Patient Instructions (Signed)

## 2019-10-15 DIAGNOSIS — R0683 Snoring: Secondary | ICD-10-CM | POA: Diagnosis not present

## 2019-10-15 DIAGNOSIS — G4730 Sleep apnea, unspecified: Secondary | ICD-10-CM | POA: Diagnosis not present

## 2019-10-15 DIAGNOSIS — E669 Obesity, unspecified: Secondary | ICD-10-CM | POA: Diagnosis not present

## 2019-10-28 ENCOUNTER — Other Ambulatory Visit
Admission: RE | Admit: 2019-10-28 | Discharge: 2019-10-28 | Disposition: A | Discharge status: Home / Self Care | Payer: 59 | Source: Ambulatory Visit | Attending: Family Medicine | Admitting: Family Medicine

## 2019-10-28 ENCOUNTER — Other Ambulatory Visit: Payer: Self-pay

## 2019-10-28 DIAGNOSIS — Z01812 Encounter for preprocedural laboratory examination: Secondary | ICD-10-CM | POA: Diagnosis not present

## 2019-10-28 DIAGNOSIS — Z20822 Contact with and (suspected) exposure to covid-19: Secondary | ICD-10-CM | POA: Diagnosis not present

## 2019-10-28 LAB — SARS CORONAVIRUS 2 (TAT 6-24 HRS): SARS Coronavirus 2: NEGATIVE

## 2019-11-01 ENCOUNTER — Ambulatory Visit: Payer: 59 | Attending: Neurology

## 2019-11-01 DIAGNOSIS — G4730 Sleep apnea, unspecified: Secondary | ICD-10-CM | POA: Diagnosis not present

## 2019-11-01 DIAGNOSIS — R0683 Snoring: Secondary | ICD-10-CM | POA: Insufficient documentation

## 2019-11-01 DIAGNOSIS — G4719 Other hypersomnia: Secondary | ICD-10-CM | POA: Insufficient documentation

## 2019-11-02 ENCOUNTER — Other Ambulatory Visit: Payer: Self-pay

## 2019-11-23 DIAGNOSIS — Z20828 Contact with and (suspected) exposure to other viral communicable diseases: Secondary | ICD-10-CM | POA: Diagnosis not present

## 2020-05-21 ENCOUNTER — Other Ambulatory Visit: Payer: Self-pay

## 2020-05-21 ENCOUNTER — Other Ambulatory Visit: Payer: 59

## 2020-05-21 DIAGNOSIS — Z20822 Contact with and (suspected) exposure to covid-19: Secondary | ICD-10-CM | POA: Diagnosis not present

## 2020-05-22 LAB — NOVEL CORONAVIRUS, NAA: SARS-CoV-2, NAA: NOT DETECTED

## 2020-05-22 LAB — SARS-COV-2, NAA 2 DAY TAT

## 2020-09-04 ENCOUNTER — Other Ambulatory Visit: Payer: 59

## 2021-01-03 IMAGING — MG DIGITAL DIAGNOSTIC BILATERAL MAMMOGRAM WITH TOMO AND CAD
8 series · 9 of 24 positions shown · non-contrast
Comparison: Previous exam(s).

CLINICAL DATA: Bilateral gynecomastia scheduled for surgical
removal. Pre-surgical screening of bilateral breasts.

EXAM:
DIGITAL DIAGNOSTIC BILATERAL MAMMOGRAM WITH CAD AND TOMO
ULTRASOUND BILATERAL BREAST

[R MLO synth-2D]
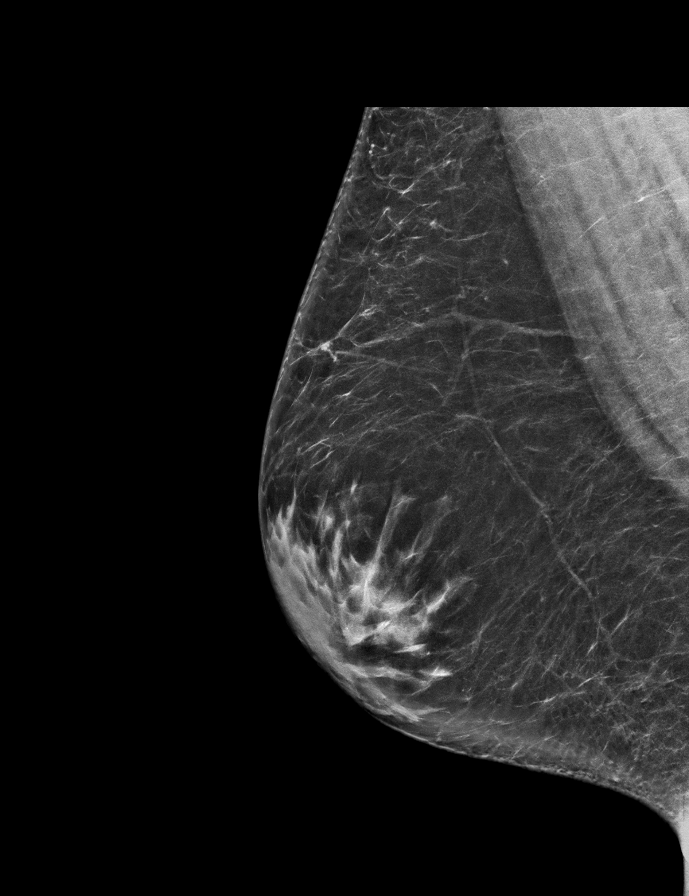

[R CC synth-2D]
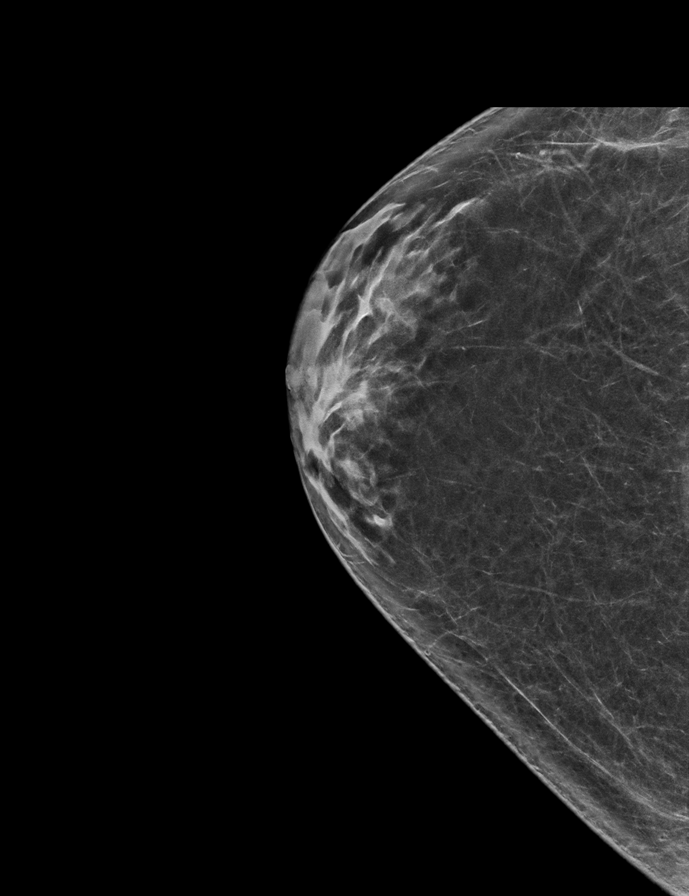

[L CC synth-2D]
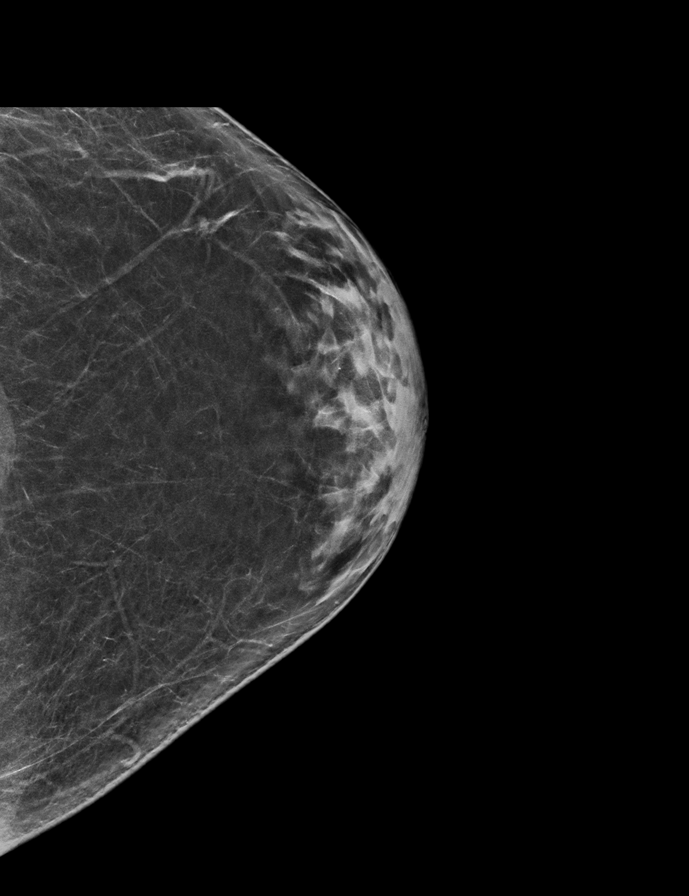

[L MLO synth-2D]
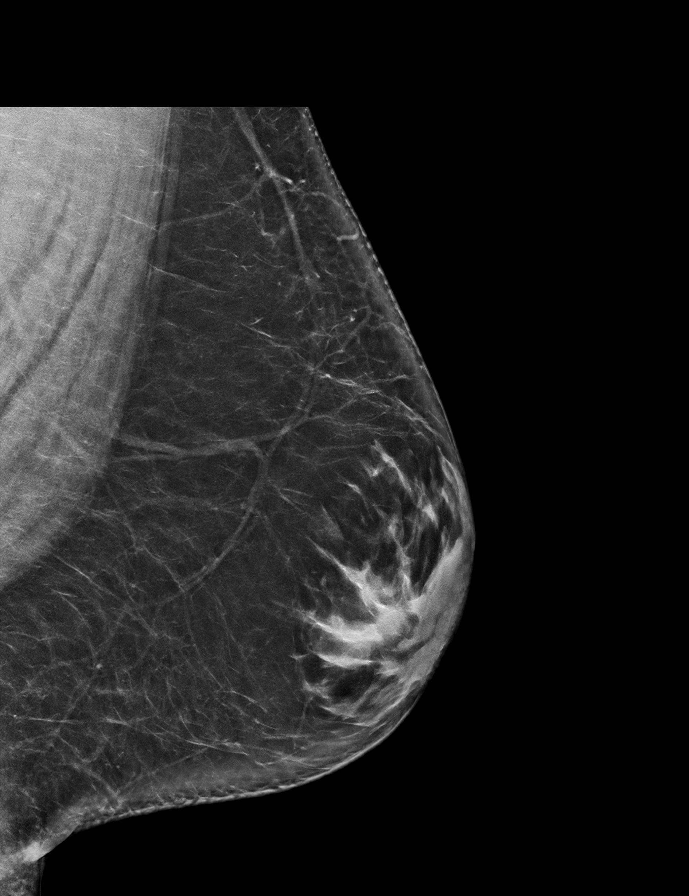

[L MLO tomo · 2 of 77 frames shown]
[frame 25/77]
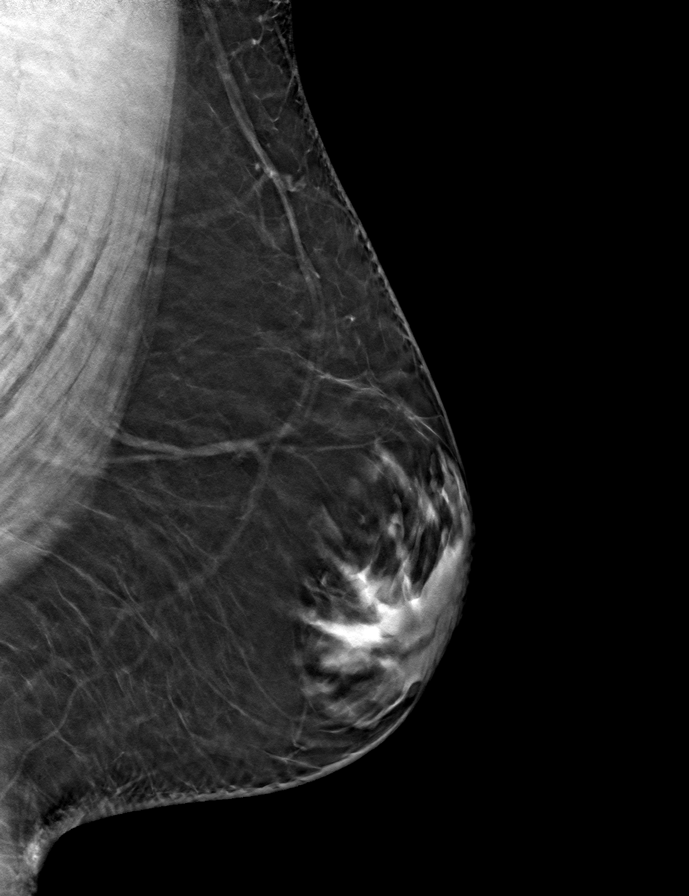
[frame 39/77]
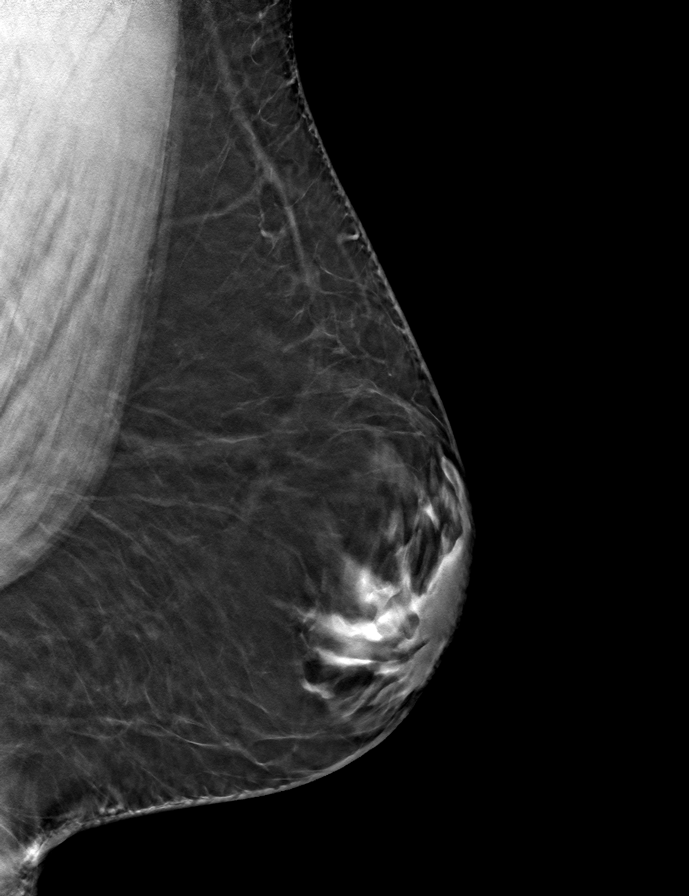

[R MLO tomo · tomo slice 39/76.0]
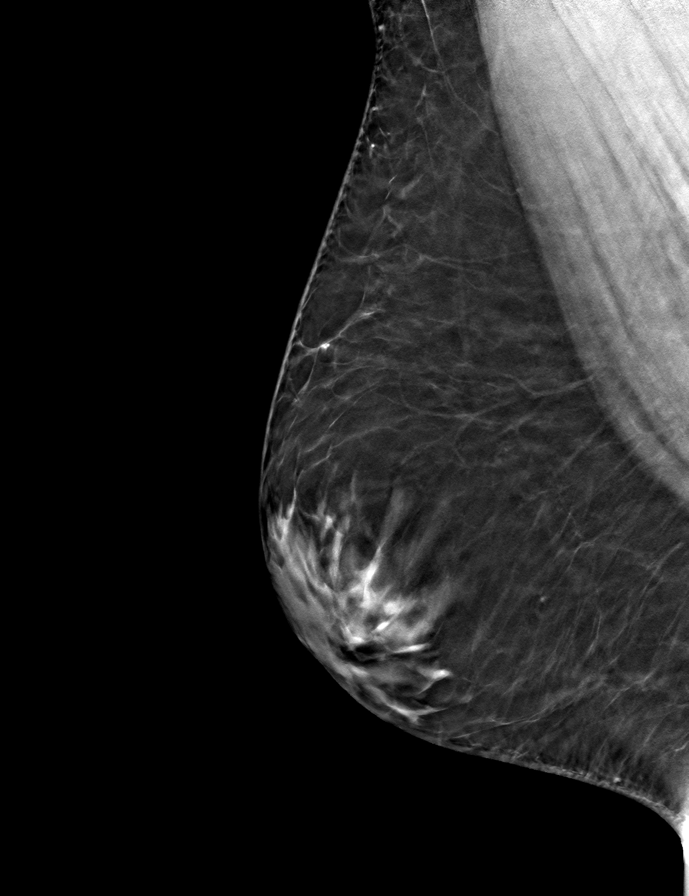

[L CC tomo · tomo slice 36/71.0]
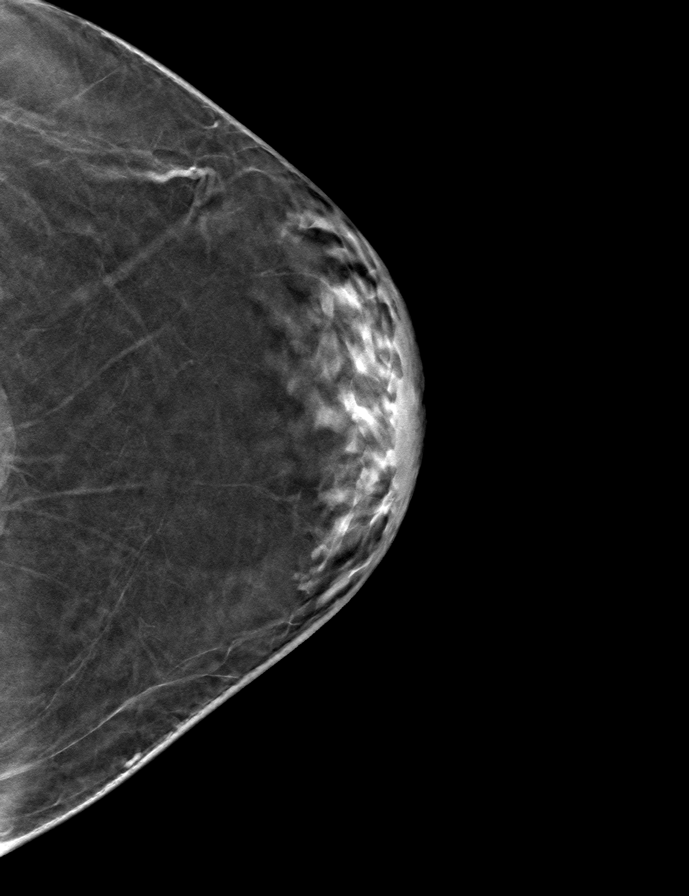

[R CC tomo · tomo slice 35/69.0]
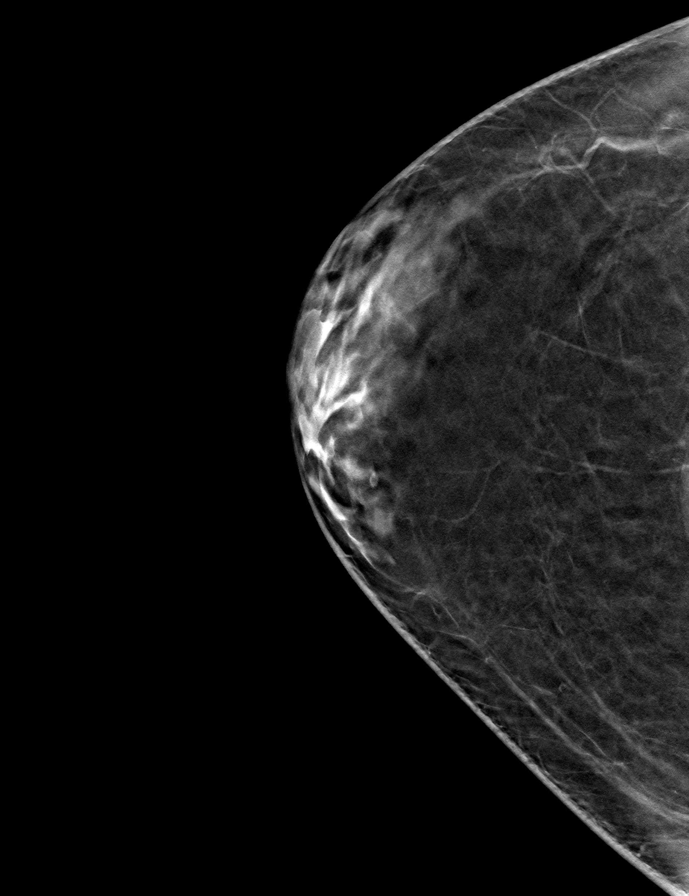

[9 of 24 positions shown; findings below may reference images not displayed]

ACR Breast Density Category b: There are scattered areas of
fibroglandular density.
FINDINGS: Cc and MLO views of bilateral breasts are submitted. No suspicious
abnormalities identified bilaterally. Bilateral gynecomastia is
identified.

Mammographic images were processed with CAD.

Targeted ultrasound is performed, showing bilateral gynecomastia. No
focal abnormal discrete cystic or solid lesion is identified
bilaterally.
IMPRESSION: Benign findings.

RECOMMENDATION:
Management on clinical basis.

I have discussed the findings and recommendations with the patient.
Results were also provided in writing at the conclusion of the
visit. If applicable, a reminder letter will be sent to the patient
regarding the next appointment.

BI-RADS CATEGORY  2: Benign.

## 2022-01-03 ENCOUNTER — Ambulatory Visit: Payer: 59 | Admitting: Family Medicine

## 2022-07-15 ENCOUNTER — Ambulatory Visit: Payer: 59 | Admitting: Family Medicine

## 2022-09-26 ENCOUNTER — Other Ambulatory Visit: Payer: Self-pay

## 2022-09-26 ENCOUNTER — Telehealth: Payer: Self-pay | Admitting: Nurse Practitioner

## 2022-09-26 DIAGNOSIS — J069 Acute upper respiratory infection, unspecified: Secondary | ICD-10-CM

## 2022-09-26 MED ORDER — FLUTICASONE PROPIONATE 50 MCG/ACT NA SUSP
2.0000 | Freq: Every day | NASAL | 6 refills | Status: AC
  Filled 2022-09-26: qty 16, 30d supply, fill #0

## 2022-09-26 MED ORDER — BENZONATATE 100 MG PO CAPS
100.0000 mg | ORAL_CAPSULE | Freq: Three times a day (TID) | ORAL | 0 refills | Status: AC | PRN
  Filled 2022-09-26: qty 30, 10d supply, fill #0

## 2022-09-26 NOTE — Progress Notes (Signed)
E-Visit for Sore Throat  We are sorry that you are not feeling well.  Here is how we plan to help!  Your symptoms indicate a likely viral infection (Pharyngitis).   Pharyngitis is inflammation in the back of the throat which can cause a sore throat, scratchiness and sometimes difficulty swallowing.   Pharyngitis is typically caused by a respiratory virus and will just run its course.   Typical viruses that cause sore throat and runny nose with cough are flu, COVID, rhinovirus(common cold).    We recommend: Coricidin HBP, use Cepacol lozenges for relief, tylenol and ibuprofen. Warm salt water gargles may also be helpful for the sore throat And we have prescribed: Meds ordered this encounter  Medications   fluticasone (FLONASE) 50 MCG/ACT nasal spray    Sig: Place 2 sprays into both nostrils daily.    Dispense:  16 g    Refill:  6   benzonatate (TESSALON) 100 MG capsule    Sig: Take 1 capsule (100 mg total) by mouth 3 (three) times daily as needed.    Dispense:  30 capsule    Refill:  0    Please keep in mind that your symptoms could last up to 10 days.  For throat pain, we recommend over the counter oral pain relief medications such as acetaminophen or aspirin, or anti-inflammatory medications such as ibuprofen or naproxen sodium.  Topical treatments such as oral throat lozenges or sprays may be used as needed.  Avoid close contact with loved ones, especially the very young and elderly.  Remember to wash your hands thoroughly throughout the day as this is the number one way to prevent the spread of infection and wipe down door knobs and counters with disinfectant.  After careful review of your answers, I would not recommend an antibiotic for your condition.  Antibiotics should not be used to treat conditions that we suspect are caused by viruses like the virus that causes the common cold or flu. However, some people can have Strep with atypical symptoms. You may need formal testing in clinic  or office to confirm if your symptoms continue or worsen.  Providers prescribe antibiotics to treat infections caused by bacteria. Antibiotics are very powerful in treating bacterial infections when they are used properly.  To maintain their effectiveness, they should be used only when necessary.  Overuse of antibiotics has resulted in the development of super bugs that are resistant to treatment!    Home Care: Only take medications as instructed by your medical team. Do not drink alcohol while taking these medications. A steam or ultrasonic humidifier can help congestion.  You can place a towel over your head and breathe in the steam from hot water coming from a faucet. Avoid close contacts especially the very young and the elderly. Cover your mouth when you cough or sneeze. Always remember to wash your hands.  Get Help Right Away If: You develop worsening fever or throat pain. You develop a severe head ache or visual changes. Your symptoms persist after you have completed your treatment plan.  Make sure you Understand these instructions. Will watch your condition. Will get help right away if you are not doing well or get worse.   Thank you for choosing an e-visit.  Your e-visit answers were reviewed by a board certified advanced clinical practitioner to complete your personal care plan. Depending upon the condition, your plan could have included both over the counter or prescription medications.  Please review your pharmacy choice. Make  sure the pharmacy is open so you can pick up prescription now. If there is a problem, you may contact your provider through [employer] and have the prescription routed to another pharmacy.  Your safety is important to [place]. If you have drug allergies check your prescription carefully.   For the next 24 hours you can use MyChart to ask questions about today's visit, request a non-urgent call back, or ask for a work or school excuse. You will get an  email in the next two days asking about your experience. I hope that your e-visit has been valuable and will speed your recovery.   I spent approximately 5 minutes reviewing the patient's history, current symptoms and coordinating their care today.

## 2022-09-29 ENCOUNTER — Other Ambulatory Visit: Payer: Self-pay

## 2022-10-12 ENCOUNTER — Other Ambulatory Visit: Payer: Self-pay
